# Patient Record
Sex: Female | Born: 1980 | Race: Black or African American | Hispanic: No | Marital: Single | Smoking: Current every day smoker
Health system: Southern US, Community
[De-identification: ages and names within clinical notes are randomized; demographics above are authoritative.]

## PROBLEM LIST (undated history)

## (undated) DIAGNOSIS — I10 Essential (primary) hypertension: Secondary | ICD-10-CM

---

## 2007-10-30 ENCOUNTER — Emergency Department: Payer: Self-pay | Admitting: Emergency Medicine

## 2015-10-14 ENCOUNTER — Encounter: Payer: Self-pay | Admitting: Medical Oncology

## 2015-10-14 ENCOUNTER — Inpatient Hospital Stay
Admission: EM | Admit: 2015-10-14 | Discharge: 2015-10-19 | DRG: 077 | Disposition: A | Discharge status: Home Health | Payer: Self-pay | Attending: Internal Medicine | Admitting: Internal Medicine

## 2015-10-14 ENCOUNTER — Emergency Department: Payer: Self-pay

## 2015-10-14 DIAGNOSIS — E1122 Type 2 diabetes mellitus with diabetic chronic kidney disease: Secondary | ICD-10-CM | POA: Diagnosis present

## 2015-10-14 DIAGNOSIS — N179 Acute kidney failure, unspecified: Secondary | ICD-10-CM

## 2015-10-14 DIAGNOSIS — E1100 Type 2 diabetes mellitus with hyperosmolarity without nonketotic hyperglycemic-hyperosmolar coma (NKHHC): Secondary | ICD-10-CM

## 2015-10-14 DIAGNOSIS — I6350 Cerebral infarction due to unspecified occlusion or stenosis of unspecified cerebral artery: Secondary | ICD-10-CM

## 2015-10-14 DIAGNOSIS — I161 Hypertensive emergency: Secondary | ICD-10-CM | POA: Diagnosis present

## 2015-10-14 DIAGNOSIS — N170 Acute kidney failure with tubular necrosis: Secondary | ICD-10-CM | POA: Diagnosis present

## 2015-10-14 DIAGNOSIS — R200 Anesthesia of skin: Secondary | ICD-10-CM

## 2015-10-14 DIAGNOSIS — N183 Chronic kidney disease, stage 3 (moderate): Secondary | ICD-10-CM | POA: Diagnosis present

## 2015-10-14 DIAGNOSIS — E860 Dehydration: Secondary | ICD-10-CM | POA: Diagnosis present

## 2015-10-14 DIAGNOSIS — F17210 Nicotine dependence, cigarettes, uncomplicated: Secondary | ICD-10-CM | POA: Diagnosis present

## 2015-10-14 DIAGNOSIS — R4182 Altered mental status, unspecified: Secondary | ICD-10-CM

## 2015-10-14 DIAGNOSIS — I674 Hypertensive encephalopathy: Principal | ICD-10-CM | POA: Diagnosis present

## 2015-10-14 DIAGNOSIS — Z8249 Family history of ischemic heart disease and other diseases of the circulatory system: Secondary | ICD-10-CM

## 2015-10-14 DIAGNOSIS — E1121 Type 2 diabetes mellitus with diabetic nephropathy: Secondary | ICD-10-CM | POA: Diagnosis present

## 2015-10-14 DIAGNOSIS — E669 Obesity, unspecified: Secondary | ICD-10-CM | POA: Diagnosis present

## 2015-10-14 DIAGNOSIS — D72829 Elevated white blood cell count, unspecified: Secondary | ICD-10-CM | POA: Diagnosis present

## 2015-10-14 DIAGNOSIS — R7989 Other specified abnormal findings of blood chemistry: Secondary | ICD-10-CM

## 2015-10-14 DIAGNOSIS — R202 Paresthesia of skin: Secondary | ICD-10-CM

## 2015-10-14 DIAGNOSIS — E876 Hypokalemia: Secondary | ICD-10-CM | POA: Diagnosis present

## 2015-10-14 DIAGNOSIS — Z833 Family history of diabetes mellitus: Secondary | ICD-10-CM

## 2015-10-14 DIAGNOSIS — R778 Other specified abnormalities of plasma proteins: Secondary | ICD-10-CM

## 2015-10-14 DIAGNOSIS — I129 Hypertensive chronic kidney disease with stage 1 through stage 4 chronic kidney disease, or unspecified chronic kidney disease: Secondary | ICD-10-CM | POA: Diagnosis present

## 2015-10-14 DIAGNOSIS — Z6841 Body Mass Index (BMI) 40.0 and over, adult: Secondary | ICD-10-CM

## 2015-10-14 DIAGNOSIS — G9340 Encephalopathy, unspecified: Secondary | ICD-10-CM | POA: Diagnosis present

## 2015-10-14 DIAGNOSIS — I639 Cerebral infarction, unspecified: Secondary | ICD-10-CM

## 2015-10-14 DIAGNOSIS — E1165 Type 2 diabetes mellitus with hyperglycemia: Secondary | ICD-10-CM | POA: Diagnosis present

## 2015-10-14 HISTORY — DX: Essential (primary) hypertension: I10

## 2015-10-14 LAB — COMPREHENSIVE METABOLIC PANEL
ALK PHOS: 101 U/L (ref 38–126)
ALK PHOS: 86 U/L (ref 38–126)
ALT: 16 U/L (ref 14–54)
ALT: 14 U/L (ref 14–54)
ANION GAP: 10 (ref 5–15)
AST: 22 U/L (ref 15–41)
AST: 17 U/L (ref 15–41)
Albumin: 4.4 g/dL (ref 3.5–5.0)
Albumin: 4.1 g/dL (ref 3.5–5.0)
Anion gap: 11 (ref 5–15)
BUN: 21 mg/dL — AB (ref 6–20)
BUN: 18 mg/dL (ref 6–20)
CALCIUM: 9.5 mg/dL (ref 8.9–10.3)
CALCIUM: 9.4 mg/dL (ref 8.9–10.3)
CO2: 24 mmol/L (ref 22–32)
CO2: 27 mmol/L (ref 22–32)
CREATININE: 2.05 mg/dL — AB (ref 0.44–1.00)
Chloride: 96 mmol/L — ABNORMAL LOW (ref 101–111)
Chloride: 105 mmol/L (ref 101–111)
Creatinine, Ser: 1.56 mg/dL — ABNORMAL HIGH (ref 0.44–1.00)
GFR, EST AFRICAN AMERICAN: 35 mL/min — AB (ref >60)
GFR, EST AFRICAN AMERICAN: 49 mL/min — AB (ref >60)
GFR, EST NON AFRICAN AMERICAN: 30 mL/min — AB (ref >60)
GFR, EST NON AFRICAN AMERICAN: 42 mL/min — AB (ref >60)
Glucose, Bld: 801 mg/dL (ref 65–99)
Glucose, Bld: 150 mg/dL — ABNORMAL HIGH (ref 65–99)
Potassium: 3.2 mmol/L — ABNORMAL LOW (ref 3.5–5.1)
Potassium: 3.1 mmol/L — ABNORMAL LOW (ref 3.5–5.1)
SODIUM: 142 mmol/L (ref 135–145)
Sodium: 131 mmol/L — ABNORMAL LOW (ref 135–145)
Total Bilirubin: 0.5 mg/dL (ref 0.3–1.2)
Total Bilirubin: 0.3 mg/dL (ref 0.3–1.2)
Total Protein: 9 g/dL — ABNORMAL HIGH (ref 6.5–8.1)
Total Protein: 8.4 g/dL — ABNORMAL HIGH (ref 6.5–8.1)

## 2015-10-14 LAB — GLUCOSE, CAPILLARY
GLUCOSE-CAPILLARY: 515 mg/dL — AB (ref 65–99)
GLUCOSE-CAPILLARY: 515 mg/dL — AB (ref 65–99)
GLUCOSE-CAPILLARY: 466 mg/dL — AB (ref 65–99)
GLUCOSE-CAPILLARY: 404 mg/dL — AB (ref 65–99)
GLUCOSE-CAPILLARY: 345 mg/dL — AB (ref 65–99)
GLUCOSE-CAPILLARY: 269 mg/dL — AB (ref 65–99)
GLUCOSE-CAPILLARY: 161 mg/dL — AB (ref 65–99)
GLUCOSE-CAPILLARY: 240 mg/dL — AB (ref 65–99)
GLUCOSE-CAPILLARY: 228 mg/dL — AB (ref 65–99)
GLUCOSE-CAPILLARY: 182 mg/dL — AB (ref 65–99)
Glucose-Capillary: 592 mg/dL (ref 65–99)
Glucose-Capillary: 598 mg/dL (ref 65–99)
Glucose-Capillary: 212 mg/dL — ABNORMAL HIGH (ref 65–99)
Glucose-Capillary: 142 mg/dL — ABNORMAL HIGH (ref 65–99)
Glucose-Capillary: 148 mg/dL — ABNORMAL HIGH (ref 65–99)

## 2015-10-14 LAB — URINALYSIS COMPLETE WITH MICROSCOPIC (ARMC ONLY)
Bacteria, UA: NONE SEEN
Bilirubin Urine: NEGATIVE
Glucose, UA: >500 mg/dL — AB
KETONES UR: NEGATIVE mg/dL
Leukocytes, UA: NEGATIVE
Nitrite: NEGATIVE
PH: 6 (ref 5.0–8.0)
PROTEIN: 100 mg/dL — AB
Specific Gravity, Urine: 1.021 (ref 1.005–1.030)

## 2015-10-14 LAB — CBC WITH DIFFERENTIAL/PLATELET
BASOS ABS: 0.1 10*3/uL (ref 0–0.1)
Basophils Relative: 1 %
Eosinophils Absolute: 0.1 10*3/uL (ref 0–0.7)
Eosinophils Relative: 0 %
HCT: 47.3 % — ABNORMAL HIGH (ref 35.0–47.0)
HEMOGLOBIN: 15 g/dL (ref 12.0–16.0)
LYMPHS ABS: 1.3 10*3/uL (ref 1.0–3.6)
LYMPHS PCT: 9 %
MCH: 23.7 pg — AB (ref 26.0–34.0)
MCHC: 31.6 g/dL — ABNORMAL LOW (ref 32.0–36.0)
MCV: 74.9 fL — AB (ref 80.0–100.0)
Monocytes Absolute: 0.9 10*3/uL (ref 0.2–0.9)
Monocytes Relative: 6 %
NEUTROS ABS: 12.8 10*3/uL — AB (ref 1.4–6.5)
NEUTROS PCT: 84 %
Platelets: 305 10*3/uL (ref 150–440)
RBC: 6.32 MIL/uL — AB (ref 3.80–5.20)
RDW: 13.9 % (ref 11.5–14.5)
WBC: 15.2 10*3/uL — AB (ref 3.6–11.0)

## 2015-10-14 LAB — URINE DRUG SCREEN, QUALITATIVE (ARMC ONLY)
Amphetamines, Ur Screen: NOT DETECTED
BARBITURATES, UR SCREEN: NOT DETECTED
BENZODIAZEPINE, UR SCRN: NOT DETECTED
CANNABINOID 50 NG, UR ~~LOC~~: NOT DETECTED
COCAINE METABOLITE, UR ~~LOC~~: NOT DETECTED
MDMA (Ecstasy)Ur Screen: NOT DETECTED
METHADONE SCREEN, URINE: NOT DETECTED
Opiate, Ur Screen: NOT DETECTED
Phencyclidine (PCP) Ur S: NOT DETECTED
TRICYCLIC, UR SCREEN: NOT DETECTED

## 2015-10-14 LAB — BLOOD GAS, VENOUS
Acid-base deficit: 1.5 mmol/L (ref 0.0–2.0)
Bicarbonate: 24.8 mEq/L (ref 21.0–28.0)
FIO2: 21
PATIENT TEMPERATURE: 37
PCO2 VEN: 47 mmHg (ref 44.0–60.0)
pH, Ven: 7.33 (ref 7.320–7.430)

## 2015-10-14 LAB — ETHANOL

## 2015-10-14 LAB — HCG, QUANTITATIVE, PREGNANCY: HCG, BETA CHAIN, QUANT, S: 1 m[IU]/mL (ref <5)

## 2015-10-14 LAB — TSH: TSH: 0.411 u[IU]/mL (ref 0.350–4.500)

## 2015-10-14 LAB — TROPONIN I: TROPONIN I: 0.04 ng/mL — AB (ref <0.031)

## 2015-10-14 MED ORDER — SODIUM CHLORIDE 0.9 % IV SOLN
1000.0000 mL | Freq: Once | INTRAVENOUS | Status: AC
  Administered 2015-10-14: 1000 mL via INTRAVENOUS

## 2015-10-14 MED ORDER — HYDROCODONE-ACETAMINOPHEN 5-325 MG PO TABS: 1.0000 | ORAL_TABLET | ORAL | Status: DC | PRN

## 2015-10-14 MED ORDER — ALUM & MAG HYDROXIDE-SIMETH 200-200-20 MG/5ML PO SUSP: 30.0000 mL | Freq: Four times a day (QID) | ORAL | Status: DC | PRN

## 2015-10-14 MED ORDER — LORAZEPAM 2 MG/ML IJ SOLN
2.0000 mg | Freq: Once | INTRAMUSCULAR | Status: AC
  Administered 2015-10-14: 2 mg via INTRAVENOUS

## 2015-10-14 MED ORDER — BISACODYL 5 MG PO TBEC: 5.0000 mg | DELAYED_RELEASE_TABLET | Freq: Every day | ORAL | Status: DC | PRN

## 2015-10-14 MED ORDER — SODIUM CHLORIDE 0.9 % IJ SOLN
3.0000 mL | Freq: Two times a day (BID) | INTRAMUSCULAR | Status: DC
  Administered 2015-10-14 – 2015-10-19 (×11): 3 mL via INTRAVENOUS

## 2015-10-14 MED ORDER — ASPIRIN 81 MG PO CHEW
81.0000 mg | CHEWABLE_TABLET | Freq: Every day | ORAL | Status: DC
  Administered 2015-10-14 – 2015-10-19 (×6): 81 mg via ORAL
  Filled 2015-10-14 (×5): qty 1

## 2015-10-14 MED ORDER — ACETAMINOPHEN 650 MG RE SUPP: 650.0000 mg | Freq: Four times a day (QID) | RECTAL | Status: DC | PRN

## 2015-10-14 MED ORDER — HYDRALAZINE HCL 20 MG/ML IJ SOLN
10.0000 mg | Freq: Four times a day (QID) | INTRAMUSCULAR | Status: DC | PRN
  Administered 2015-10-14 – 2015-10-15 (×3): 10 mg via INTRAVENOUS
  Filled 2015-10-14 (×3): qty 1

## 2015-10-14 MED ORDER — INFLUENZA VAC SPLIT QUAD 0.5 ML IM SUSY
0.5000 mL | PREFILLED_SYRINGE | INTRAMUSCULAR | Status: AC
  Administered 2015-10-15: 0.5 mL via INTRAMUSCULAR
  Filled 2015-10-14: qty 0.5

## 2015-10-14 MED ORDER — LABETALOL HCL 5 MG/ML IV SOLN
20.0000 mg | Freq: Once | INTRAVENOUS | Status: AC
  Administered 2015-10-14: 20 mg via INTRAVENOUS

## 2015-10-14 MED ORDER — LORAZEPAM 2 MG/ML IJ SOLN
INTRAMUSCULAR | Status: AC
  Filled 2015-10-14: qty 1

## 2015-10-14 MED ORDER — SODIUM CHLORIDE 0.9 % IV SOLN
INTRAVENOUS | Status: DC
  Administered 2015-10-14: 17:00:00 via INTRAVENOUS

## 2015-10-14 MED ORDER — POTASSIUM CHLORIDE 10 MEQ/100ML IV SOLN
10.0000 meq | INTRAVENOUS | Status: AC
  Administered 2015-10-14 – 2015-10-15 (×4): 10 meq via INTRAVENOUS
  Filled 2015-10-14 (×5): qty 100

## 2015-10-14 MED ORDER — ATORVASTATIN CALCIUM 20 MG PO TABS
40.0000 mg | ORAL_TABLET | Freq: Every day | ORAL | Status: DC
  Administered 2015-10-14 – 2015-10-18 (×5): 40 mg via ORAL
  Filled 2015-10-14 (×4): qty 2

## 2015-10-14 MED ORDER — INSULIN REGULAR HUMAN 100 UNIT/ML IJ SOLN
INTRAMUSCULAR | Status: DC
  Filled 2015-10-14: qty 2.5

## 2015-10-14 MED ORDER — TRAZODONE HCL 50 MG PO TABS
25.0000 mg | ORAL_TABLET | Freq: Every evening | ORAL | Status: DC | PRN
  Administered 2015-10-16: 25 mg via ORAL
  Filled 2015-10-14: qty 1

## 2015-10-14 MED ORDER — SODIUM CHLORIDE 0.9 % IV SOLN
INTRAVENOUS | Status: DC
  Administered 2015-10-14: 4.1 [IU]/h via INTRAVENOUS
  Administered 2015-10-14: 5.4 [IU]/h via INTRAVENOUS
  Filled 2015-10-14: qty 2.5

## 2015-10-14 MED ORDER — ACETAMINOPHEN 325 MG PO TABS
650.0000 mg | ORAL_TABLET | Freq: Four times a day (QID) | ORAL | Status: DC | PRN
  Administered 2015-10-15 – 2015-10-16 (×2): 650 mg via ORAL
  Filled 2015-10-14 (×2): qty 2

## 2015-10-14 MED ORDER — PNEUMOCOCCAL VAC POLYVALENT 25 MCG/0.5ML IJ INJ
0.5000 mL | INJECTION | INTRAMUSCULAR | Status: AC
  Administered 2015-10-15: 0.5 mL via INTRAMUSCULAR
  Filled 2015-10-14: qty 0.5

## 2015-10-14 MED ORDER — LABETALOL HCL 5 MG/ML IV SOLN
INTRAVENOUS | Status: AC
  Administered 2015-10-14: 20 mg via INTRAVENOUS
  Filled 2015-10-14: qty 4

## 2015-10-14 MED ORDER — KCL IN DEXTROSE-NACL 40-5-0.45 MEQ/L-%-% IV SOLN
INTRAVENOUS | Status: DC
  Administered 2015-10-14: 19:00:00 via INTRAVENOUS
  Filled 2015-10-14 (×6): qty 1000

## 2015-10-14 MED ORDER — LABETALOL HCL 5 MG/ML IV SOLN
10.0000 mg | INTRAVENOUS | Status: DC | PRN
  Administered 2015-10-14 – 2015-10-18 (×7): 10 mg via INTRAVENOUS
  Filled 2015-10-14 (×9): qty 4

## 2015-10-14 MED ORDER — DEXMEDETOMIDINE HCL IN NACL 400 MCG/100ML IV SOLN
0.4000 ug/kg/h | INTRAVENOUS | Status: DC
  Administered 2015-10-14: 0.5 ug/kg/h via INTRAVENOUS
  Administered 2015-10-15: 0.1 ug/kg/h via INTRAVENOUS
  Filled 2015-10-14 (×2): qty 100

## 2015-10-14 MED ORDER — HEPARIN SODIUM (PORCINE) 5000 UNIT/ML IJ SOLN
5000.0000 [IU] | Freq: Three times a day (TID) | INTRAMUSCULAR | Status: DC
  Administered 2015-10-14 – 2015-10-19 (×16): 5000 [IU] via SUBCUTANEOUS
  Filled 2015-10-14 (×15): qty 1

## 2015-10-14 MED ORDER — ONDANSETRON HCL 4 MG/2ML IJ SOLN: 4.0000 mg | Freq: Four times a day (QID) | INTRAMUSCULAR | Status: DC | PRN

## 2015-10-14 MED ORDER — ASPIRIN 81 MG PO CHEW
324.0000 mg | CHEWABLE_TABLET | Freq: Once | ORAL | Status: AC
  Administered 2015-10-14: 324 mg via ORAL
  Filled 2015-10-14: qty 4

## 2015-10-14 MED ORDER — ONDANSETRON HCL 4 MG PO TABS: 4.0000 mg | ORAL_TABLET | Freq: Four times a day (QID) | ORAL | Status: DC | PRN

## 2015-10-14 MED ORDER — LORAZEPAM 2 MG/ML IJ SOLN
INTRAMUSCULAR | Status: AC
  Administered 2015-10-14: 2 mg via INTRAVENOUS
  Filled 2015-10-14: qty 1

## 2015-10-14 MED ORDER — LORAZEPAM 2 MG/ML IJ SOLN
2.0000 mg | Freq: Once | INTRAMUSCULAR | Status: AC
  Administered 2015-10-14: 2 mg via INTRAVENOUS
  Filled 2015-10-14: qty 1

## 2015-10-14 MED ORDER — POTASSIUM CHLORIDE IN NACL 40-0.9 MEQ/L-% IV SOLN
INTRAVENOUS | Status: DC
  Administered 2015-10-14: 100 mL/h via INTRAVENOUS
  Filled 2015-10-14 (×3): qty 1000

## 2015-10-14 MED ORDER — CHLORHEXIDINE GLUCONATE 0.12 % MT SOLN
15.0000 mL | Freq: Two times a day (BID) | OROMUCOSAL | Status: DC
  Administered 2015-10-14 – 2015-10-16 (×4): 15 mL via OROMUCOSAL
  Filled 2015-10-14 (×2): qty 15

## 2015-10-14 MED ORDER — NICARDIPINE HCL IN NACL 20-0.86 MG/200ML-% IV SOLN
3.0000 mg/h | INTRAVENOUS | Status: DC
  Filled 2015-10-14: qty 200

## 2015-10-14 MED ORDER — DOCUSATE SODIUM 100 MG PO CAPS
100.0000 mg | ORAL_CAPSULE | Freq: Two times a day (BID) | ORAL | Status: DC
Start: 2015-10-14 — End: 2015-10-19
  Administered 2015-10-15 – 2015-10-19 (×9): 100 mg via ORAL
  Filled 2015-10-14 (×9): qty 1

## 2015-10-14 MED ORDER — CETYLPYRIDINIUM CHLORIDE 0.05 % MT LIQD
7.0000 mL | Freq: Two times a day (BID) | OROMUCOSAL | Status: DC
  Administered 2015-10-14 – 2015-10-19 (×6): 7 mL via OROMUCOSAL

## 2015-10-14 MED ORDER — SODIUM CHLORIDE 0.9 % IV SOLN
INTRAVENOUS | Status: DC
  Administered 2015-10-14: 11.8 [IU]/h via INTRAVENOUS
  Administered 2015-10-14: 10.5 [IU]/h via INTRAVENOUS
  Administered 2015-10-15: 1.8 [IU]/h via INTRAVENOUS
  Administered 2015-10-15: 2.4 [IU]/h via INTRAVENOUS
  Administered 2015-10-15: 2.5 [IU]/h via INTRAVENOUS
  Administered 2015-10-15: 8.5 [IU]/h via INTRAVENOUS

## 2015-10-14 NOTE — ED Notes (Signed)
Pt from home via ems with reports that pts father found pt laying on the floor "not acting right", when ems arrived pt was confused and combative. Pt was noted to have Blood glucose "high" with ems.

## 2015-10-14 NOTE — Progress Notes (Signed)
Patients blood pressure has increased. >180/>120. MD notified and ordered Hydralazine 10mg  IV Q 6 hours PRN.

## 2015-10-14 NOTE — ED Provider Notes (Addendum)
[facility] Emergency Department Provider Note   L5 caveat: Review of systems and history cannot be obtained due to altered mental status  Time seen: ----------------------------------------- 9:03 AM on 10/14/2015 -----------------------------------------    I have reviewed the triage vital signs and the nursing notes.   HISTORY  Chief Complaint Altered Mental Status    HPI Sabrina Davenport is a 34 y.o. female brought to the ER by EMS with confusion. Patient was found by her father laying on the floor not acting right. When EMS arrived patient was confused and combative, brought in prone and restrained. Patient was noted to have high blood glucose noted by EMS. Patient was also noted to have blood in and around her mouth, concerned she may have had a seizure. Patient reportedly does not have a seizure history.   No past medical history on file.  There are no active problems to display for this patient.   No past surgical history on file.  Allergies Review of patient's allergies indicates not on file.  Social History Social History  Substance Use Topics  . Smoking status: Not on file  . Smokeless tobacco: Not on file  . Alcohol Use: Not on file    Review of Systems ENT: Positive for blood in her mouth Neurological:  Positive for altered mental status  Review of systems otherwise unknown.  ____________________________________________   PHYSICAL EXAM:  VITAL SIGNS: ED Triage Vitals  Enc Vitals Group     BP --      Pulse --      Resp --      Temp --      Temp src --      SpO2 --      Weight --      Height --      Head Cir --      Peak Flow --      Pain Score --      Pain Loc --      Pain Edu? --      Excl. in GC? --     Constitutional: Alert but disoriented and combative at times. Moderate distress. Eyes: Conjunctivae are normal. PERRL. Normal extraocular movements. ENT   Head: Normocephalic and atraumatic.   Nose:  No congestion/rhinnorhea.   Mouth/Throat: Mucous membranes are moist, there is dried blood in the oral mucosa and on her teeth.   Neck: No stridor. Cardiovascular: Rapid rate, regular rhythm. Normal and symmetric distal pulses are present in all extremities. No murmurs, rubs, or gallops. Respiratory: Normal respiratory effort without tachypnea nor retractions. Breath sounds are clear and equal bilaterally Gastrointestinal: Soft and nontender. No distention.  Musculoskeletal: Nontender with normal range of motion in all extremities. No joint effusions.  No lower extremity tenderness nor edema. Neurologic:  Abnormal speech and language, patient is confused and combative. Skin:  Skin is warm, dry and intact. No rash noted. ____________________________________________  EKG: Interpreted by me. Sinus tachycardia with a rate of 131 bpm, normal PR interval, normal QRS with, normal QT interval. There is T wave inversions.  ____________________________________________  ED COURSE:  Pertinent labs & imaging results that were available during my care of the patient were reviewed by me and considered in my medical decision making (see chart for details). Unclear etiology of her symptoms, possible postictal state with hyperglycemia. They did blood sugar read critical high. ____________________________________________    LABS (pertinent positives/negatives)  Labs Reviewed  CBC WITH DIFFERENTIAL/PLATELET - Abnormal; Notable for the following:  WBC 15.2 (*)    RBC 6.32 (*)    HCT 47.3 (*)    MCV 74.9 (*)    MCH 23.7 (*)    MCHC 31.6 (*)    Neutro Abs 12.8 (*)    All other components within normal limits  COMPREHENSIVE METABOLIC PANEL - Abnormal; Notable for the following:    Sodium 131 (*)    Potassium 3.2 (*)    Chloride 96 (*)    Glucose, Bld 801 (*)    BUN 21 (*)    Creatinine, Ser 2.05 (*)    Total Protein 9.0 (*)    GFR calc non Af Amer 30 (*)    GFR calc Af Amer 35 (*)    All  other components within normal limits  TROPONIN I - Abnormal; Notable for the following:    Troponin I 0.04 (*)    All other components within normal limits  URINALYSIS COMPLETEWITH MICROSCOPIC ([facility] ONLY) - Abnormal; Notable for the following:    Color, Urine STRAW (*)    APPearance CLEAR (*)    Glucose, UA >500 (*)    Hgb urine dipstick 1+ (*)    Protein, ur 100 (*)    Squamous Epithelial / LPF 0-5 (*)    All other components within normal limits  GLUCOSE, CAPILLARY - Abnormal; Notable for the following:    Glucose-Capillary >600 (*)    All other components within normal limits  HCG, QUANTITATIVE, PREGNANCY  ETHANOL  URINE DRUG SCREEN, QUALITATIVE ([facility] ONLY)  BLOOD GAS, VENOUS  CBG MONITORING, ED   CRITICAL CARE Performed by: Emily Filbert   Total critical care time: 45 minutes  Critical care time was exclusive of separately billable procedures and treating other patients.  Critical care was necessary to treat or prevent imminent or life-threatening deterioration.  Critical care was time spent personally by me on the following activities: development of treatment plan with patient and/or surrogate as well as nursing, discussions with consultants, evaluation of patient's response to treatment, examination of patient, obtaining history from patient or surrogate, ordering and performing treatments and interventions, ordering and review of laboratory studies, ordering and review of radiographic studies, pulse oximetry and re-evaluation of patient's condition.   RADIOLOGY  CT head IMPRESSION: Small probable acute infarct in the anterior limb of the right external capsule. Gray-white compartments elsewhere appear normal. No hemorrhage or mass effect. ____________________________________________  FINAL ASSESSMENT AND PLAN  Altered mental status, hyperglycemic hyperosmolar, acute renal failure, CVA, hypertension  Plan: Patient with labs and imaging as dictated  above. Patient with multiple acute emergency medical conditions. Notably patient is still altered from what is likely hyperglycemic hyperosmolar nonketotic ketosis. She presents also with acute renal failure, likely recent CVA. Patient will be given aspirin here, will continue on insulin drip. She has received 2 L of saline to this point, is improved significantly. We have also given her a dose of labetalol 20 mg IV for treatment of her hypertension is being placed on a nicardipine drip for hypertensive emergency.. She remains in critical condition.   Emily Filbert, MD   Emily Filbert, MD 10/14/15 1117  Emily Filbert, MD 10/14/15 [phone]

## 2015-10-14 NOTE — Progress Notes (Signed)
Patient transferred to unit from ED. Patient is alert and oriented x 3. She has delayed resopnse with slurred speech. MD is aware. VS: BP 148/104 mmHg  Pulse 114  Temp(Src) 97 F (36.1 C) (Axillary)  Resp 26  Ht 5\' 8"  (1.727 m)  Wt 101 kg (222 lb 10.6 oz)  BMI 33.86 kg/m2  SpO2 98%. Patient restless in bed with safety sitter at bedside.

## 2015-10-14 NOTE — Consult Note (Addendum)
Endocrine Initial Consult Note Date of Consult: 10/14/2015  MD Requesting Consult: Delfino Lovett  SUBJECTIVE: Reason for Consultation: hyperglycemia   History of Present Illness: Sabrina Davenport is a 34 y.o. female with PMH hypertension admitted with altered mental status, severe hypertension, and hyperglycemia with BG 800's. The history was obtained from the patient's mother since the patient is unable to provide a history. Mother reports that Ms. Kertesz does not have a PCP. To her knowledge, the patient has not been taking medication for her hypertension. The patient does not have a known history of diabetes. Neither parents or sibling have diabetes but she has maternal relatives with diabetes. The patient was in her usual state of health until today when the patient's father came home to fine her confused. He witnessed her fall and apparently she lay on the ground with her eyes closed breathing "hard." Mother denies any witness jerking/shaking, tongue biting, or bowel/bladder incontinence. Mother denies any recent sick contacts and states the patient has not shown any signs of illness prior to today. EMS was called and reportedly patient was quite confused, combative, and hypertensive upon EMS arrival. She was transported to the [facility] ED.  Per mother, Ms Papandrea takes no medications currently. She smokes <1/2 ppd of cigarettes. No alcohol or illicit drug use. She lives with her parents.   Patient Active Problem List   Diagnosis Date Noted  . Encephalopathy acute 10/14/2015     Past Medical History  Diagnosis Date  . Hypertension    History reviewed. No pertinent past surgical history. Family History  Problem Relation Age of Onset  . Hypertension Mother     Social History:  Social History  Substance Use Topics  . Smoking status: Current Every Day Smoker -- 0.50 packs/day for 10 years    Types: Cigarettes  . Smokeless tobacco: Not on file  . Alcohol Use: No     No Known Allergies    Medications:  none  Review of Systems: Could not be completed given patient's mental state. However, mother states the patient has not complained of fevers, abdominal pain, nausea, vomiting, or any other symptoms.   OBJECTIVE: Temp:  [97 F (36.1 C)] 97 F (36.1 C) (10/27 0859) Pulse Rate:  [101-130] 102 (10/27 1800) Resp:  [14-33] 30 (10/27 1700) BP: (131-239)/(83-169) 186/130 mmHg (10/27 1800) SpO2:  [93 %-100 %] 99 % (10/27 1800) FiO2 (%):  [21 %] 21 % (10/27 1623) Weight:  [101 kg (222 lb 10.6 oz)-126.1 kg (278 lb)] 101 kg (222 lb 10.6 oz) (10/27 1631)  Temp (24hrs), Avg:97 F (36.1 C), Min:97 F (36.1 C), Max:97 F (36.1 C)  Weight: 101 kg (222 lb 10.6 oz)  Physical Exam: Gen: lying in bed, easily arousable but disoriented, NAD HEENT: NCAT, eyes anicteric, pupils not visualized well as patient unable to keep eyes open, mucous membranes dry Neck: Supple CVS: tachycardic, s1, s2, no murmur appreciated Lungs: limited auscultation with patient not taking deep breaths on command Abd: soft, nontender to palpation Extr: warm, well-perfused, no ulcerations on either feet Skin: warm, dry Neuro: disoriented, not following commands, not verbalizing, could not illicit reflexes Psych: unable to assess  Labs: BMP Latest Ref Rng 10/14/2015  Glucose 65 - 99 mg/dL 469(GE)  BUN 6 - 20 mg/dL 95(M)  Creatinine 8.41 - 1.00 mg/dL 3.24(M)  Sodium 010 - 272 mmol/L 131(L)  Potassium 3.5 - 5.1 mmol/L 3.2(L)  Chloride 101 - 111 mmol/L 96(L)  CO2 22 - 32 mmol/L 24  Calcium 8.9 -  10.3 mg/dL 9.5   CBC Latest Ref Rng 10/14/2015  WBC 3.6 - 11.0 K/uL 15.2(H)  Hemoglobin 12.0 - 16.0 g/dL 16.115.0  Hematocrit 09.635.0 - 47.0 % 47.3(H)  Platelets 150 - 440 K/uL 305   Blood glucose values reviewed in glucose accordion view  ASSESSMENT:   34 yo woman presenting with altered mental status admitted to the ICU with hypertensive urgency/emergency, severe hyperglycemia, acute renal failure, and  electrolyte derangements including hypokalemia. Anion gap is within normal limits. She is in a hyperosmolar hyperglycemic state (HHS) in the setting of uncontrolled undiagnosed type 2 diabetes mellitus.  RECOMMENDATIONS:   Aggressive IV fluid hydration with normal saline, then switch to D5 1/2 NS once BG<250 Monitor urine output closely, foley cath if needed Electrolyte repletion; especially potassium as insulin will drive levels down Would replete with at least 60-80 mEq KCl over the next 6-8 hours; 40 mEq now Monitor BMP q4-6hr until stable; STAT BMP now IV insulin infusion 0.1u/kg/hr, then titrate to maintain BG 200-300 range Would not be too aggressive with glycemic control until mental status starts to improve Glucose monitoring: q1h Hypoglycemia protocol in place Hb A1c pending Once her condition stabilizes, we can tighten glycemic control further Nephrology is following for management of her hypertension and ARF Recommendations were discussed with the ICU RN caring for the patient Will follow along closely. Patient will be seen by Dr. Tedd SiasSolum tomorrow  Thank you for allowing me to participate in this patient's care.  Doylene CanningAbby Carena Stream, MD [facility]

## 2015-10-14 NOTE — Progress Notes (Signed)
MRI called and stated that she cannot have MRI until off of insulin drip due to the fact that the IV machine cannot go near MRI. MD notified and agreeable to doing MRI after she is off of the insulin drip. See passed the bedside swallow eval and MD ordered pt to start on clear liquid diet.

## 2015-10-14 NOTE — H&P (Signed)
[facility] [place] [facility]   PATIENT NAME: Sabrina FallsJennifer Davenport    MR#:  [id number]  DATE OF BIRTH:  [birth year]  DATE OF ADMISSION:  10/14/2015  PRIMARY CARE PHYSICIAN: No primary care provider on file.   REQUESTING/REFERRING PHYSICIAN: Daryel NovemberWilliams, Jonathan, M.D.  CHIEF COMPLAINT:   Chief Complaint  Patient presents with  . Altered Mental Status   HISTORY OF PRESENT ILLNESS:  Sabrina Davenport  is a 34 y.o. female with a known history of hypertension brought to the ER by EMS with confusion. Patient was found by her father laying on the floor not acting right. When EMS arrived patient was confused and combative, brought in prone and restrained. Patient was noted to have high blood glucose noted by EMS. Patient was also noted to have blood in and around her mouth, concerned she may have had a seizure. Patient reportedly does not have a seizure history.  As per patient's mother, who is at bedside, he may have passed out in the kitchen as there was a lot of stuff knocked over there.  Usually she could not identify her dad, but she is able to identify her mother now and knows that she is here to hospital but could not tell me the name.  She is still quite agitated and confused and her blood pressure was 220/110 when I saw her. PAST MEDICAL HISTORY:  History reviewed. No pertinent past medical history. Hypertension PAST SURGICAL HISTORY:  History reviewed. No pertinent past surgical history. SOCIAL HISTORY:   Social History  Substance Use Topics  . Smoking status: Unknown If Ever Smoked  . Smokeless tobacco: Not on file  . Alcohol Use: Not on file  , smokes about half a Cigarette daily.  Her last 8-10 years.  No cough.  She lives at home with her parents. FAMILY HISTORY:  No family history on file.  Mother with hypertension.  Grandmother had diabetes DRUG ALLERGIES:  No Known Allergies REVIEW OF SYSTEMS:   Review of Systems  Unable to perform ROS: critical  illness   MEDICATIONS AT HOME:   Prior to Admission medications   Not on File  Was supposed to be in some blood pressure medication, although her mother says she had never taken any and does not recall what's the name of the medication either  VITAL SIGNS:  Blood pressure 205/130, pulse 106, temperature 97 F (36.1 C), temperature source Axillary, resp. rate 22, height 5\' 9"  (1.753 m), weight 126.1 kg (278 lb), SpO2 100 %. PHYSICAL EXAMINATION:  Physical Exam  Constitutional: She appears distressed.  HENT:  Head: Normocephalic and atraumatic.  Eyes: Conjunctivae and EOM are normal. Pupils are equal, round, and reactive to light.  Neck: Normal range of motion. Neck supple. No tracheal deviation present. No thyromegaly present.  Cardiovascular: Normal heart sounds.  Tachycardia present.   Pulmonary/Chest: Breath sounds normal. Tachypnea noted. She is in respiratory distress. She has no wheezes. She exhibits no tenderness.  Abdominal: Soft. Bowel sounds are normal. She exhibits no distension. There is no tenderness.  Musculoskeletal: Normal range of motion.  Neurological: She has normal sensation, normal strength and intact cranial nerves. She is disoriented. No cranial nerve deficit.  Skin: Skin is warm and dry. No rash noted.  Psychiatric: Mood and affect normal.  She has agitated and confused.   LABORATORY PANEL:   CBC  Recent Labs Lab 10/14/15 0905  WBC 15.2*  HGB 15.0  HCT 47.3*  PLT 305   ------------------------------------------------------------------------------------------------------------------  Chemistries  Recent Labs Lab 10/14/15 0905  NA 131*  K 3.2*  CL 96*  CO2 24  GLUCOSE 801*  BUN 21*  CREATININE 2.05*  CALCIUM 9.5  AST 22  ALT 16  ALKPHOS 101  BILITOT 0.5   ------------------------------------------------------------------------------------------------------------------  Cardiac Enzymes  Recent Labs Lab 10/14/15 0905  TROPONINI  0.04*   ------------------------------------------------------------------------------------------------------------------  RADIOLOGY:  Ct Head Wo Contrast  10/14/2015  CLINICAL DATA:  confusion/altered mental status EXAM: CT HEAD WITHOUT CONTRAST TECHNIQUE: Contiguous axial images were obtained from the base of the skull through the vertex without intravenous contrast. COMPARISON:  None. FINDINGS: The ventricles are normal in size and configuration. Cranial mass, hemorrhage, extra-axial fluid collection, or midline shift. There is a small focus of decreased attenuation in the anterior limb of the right external capsule consistent with a small recent infarct. Elsewhere gray-white compartments are normal. Bony calvarium appears intact. The mastoid air cells are clear. 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 Gray-white compartments elsewhere appear normal. No hemorrhage or mass effect. Electronically Signed   By: Bretta Bang III M.D.   On: 10/14/2015 11:07   IMPRESSION AND PLAN:  34 year old female with a known history of hypertension, not on medication.  He is being admitted for acute metabolic encephalopathy  * Acute metabolic encephalopathy: Likely multifactorial, not rule out underlying stroke as initial CT scan is worrisome for acute infarct.  We will get MRI of the brain, neurology consult.  This certainly could be due to hyper glycemic hyperosmolar state.  She also has hypertensive crisis  Will monitor in the ICU, and c/s intensivist for critical care management  * Acute renal failure: Could be prerenal.  Will start on aggressive IV hydration and consult nephrology.  Avoid any nephrotoxic medications   * hyperglycemic hyperosmolar nonketotic state: We will start on insulin drip and consult endocrinology.  This is new onset diabetes.  We will consult diabetic nurse coordinator.  Check hemoglobin A1c.  Monitor in CCU with aggressive  hydration..  TSH is normal  * Acute CVA: Seen on initial head CT, We will get MRI of the brain, neurology consult.  Start aspirin.  Check lipid profile and also add statin  * Hypertensive emergency: has recieved dose of labetalol 20 mg IV in ED and will started on nicardipine drip for hypertensive emergency.  Her pressure is getting better control on this.  She remains in critical condition.    All the records are reviewed and case discussed with ED provider. Management plans discussed with the patient, family and they are in agreement.  CODE STATUS: Full code  TOTAL TIME (Critical Care) TAKING CARE OF THIS PATIENT: 55 minutes.    [facility], Javone Ybanez M.D on 10/14/2015 at 1:05 PM  Between 7am to 6pm - Pager - [phone]  After 6pm go to [email or web address] - password [email or web address] [facility]  [place] [facility]  Office  [phone]  CC: Primary care physician; No primary care provider on file.

## 2015-10-14 NOTE — Progress Notes (Signed)
MD asked that patient be a strict I&O. Currently, pt is continent of her urine. Will attempt to measure urine without foley catheter, if she becomes incontinent MD asks that she have a foley catheter placed as she is in acute renal failure.

## 2015-10-14 NOTE — Consult Note (Signed)
Neurology:  [id number]  HPI: Sabrina Davenport is an 34 y.o. female known history of hypertension brought to the ER by EMS with confusion. Patient was found by her father laying on the floor not acting right. When EMS arrived patient was confused and combative, brought in prone and restrained. Patient was noted to have high blood glucose as well as elevated BP, CTH R internal capsule subacute stroke.    Past Medical History  Diagnosis Date  . Hypertension     History reviewed. No pertinent past surgical history.  Family History  Problem Relation Age of Onset  . Hypertension Mother     Social History:  reports that she has been smoking Cigarettes.  She has a 5 pack-year smoking history. She does not have any smokeless tobacco history on file. She reports that she does not drink alcohol. Her drug history is not on file.  No Known Allergies  Medications: I have reviewed the patient's current medications.  ROS: History obtained from the patient  General ROS: negative for - chills, fatigue, fever, night sweats, weight gain or weight loss Psychological ROS: negative for - behavioral disorder, hallucinations, memory difficulties, mood swings or suicidal ideation Ophthalmic ROS: negative for - blurry vision, double vision, eye pain or loss of vision ENT ROS: negative for - epistaxis, nasal discharge, oral lesions, sore throat, tinnitus or vertigo Allergy and Immunology ROS: negative for - hives or itchy/watery eyes Hematological and Lymphatic ROS: negative for - bleeding problems, bruising or swollen lymph nodes Endocrine ROS: history of DM Respiratory ROS: negative for - cough, hemoptysis, shortness of breath or wheezing Cardiovascular ROS: negative for - chest pain, dyspnea on exertion, edema or irregular heartbeat Gastrointestinal ROS: negative for - abdominal pain, diarrhea, hematemesis, nausea/vomiting or stool incontinence Genito-Urinary ROS: negative for - dysuria, hematuria,  incontinence or urinary frequency/urgency Musculoskeletal ROS: negative for - joint swelling or muscular weakness Neurological ROS: as noted in HPI Dermatological ROS: negative for rash and skin lesion changes  Physical Examination: Blood pressure 186/130, pulse 102, temperature 97 F (36.1 C), temperature source Axillary, resp. rate 30, height  (1.727 m), weight 222 lb 10.6 oz (101 kg), SpO2 99 %.   Neurological Examination Mental Status: Alert, oriented, thought content appropriate.  Speech fluent without evidence of aphasia.  Able to follow 3 step commands without difficulty. Cranial Nerves: II: Discs flat bilaterally; Visual fields grossly normal, pupils equal, round, reactive to light and accommodation III,IV, VI: ptosis not present, extra-ocular motions intact bilaterally V,VII: smile symmetric, facial light touch sensation normal bilaterally VIII: hearing normal bilaterally IX,X: gag reflex present XI: bilateral shoulder shrug XII: midline tongue extension Motor: Right : Upper extremity   5/5    Left:     Upper extremity   5/5  Lower extremity   5/5     Lower extremity   5/5 Tone and bulk:normal tone throughout; no atrophy noted Sensory: Pinprick and light touch intact throughout, bilaterally Deep Tendon Reflexes: 1+ and symmetric throughout Plantars: Right: downgoing   Left: downgoing Cerebellar: normal finger-to-nose, normal rapid alternating movements and normal heel-to-shin test Gait: not tested       Laboratory Studies:   Basic Metabolic Panel:  Recent Labs Lab 10/14/15 0905 10/14/15 1849  NA 131* 142  K 3.2* 3.1*  CL 96* 105  CO2 24 27  GLUCOSE 801* 150*  BUN 21* 18  CREATININE 2.05* 1.56*  CALCIUM 9.5 9.4    Liver Function Tests:  Recent Labs Lab 10/14/15 0905 10/14/15 1849  AST 22 17  ALT 16 14  ALKPHOS 101 86  BILITOT 0.5 0.3  PROT 9.0* 8.4*  ALBUMIN 4.4 4.1   No results for input(s): LIPASE, AMYLASE in the last 168 hours. No  results for input(s): AMMONIA in the last 168 hours.  CBC:  Recent Labs Lab 10/14/15 0905  WBC 15.2*  NEUTROABS 12.8*  HGB 15.0  HCT 47.3*  MCV 74.9*  PLT 305    Cardiac Enzymes:  Recent Labs Lab 10/14/15 0905  TROPONINI 0.04*    BNP: Invalid input(s): POCBNP  CBG:  Recent Labs Lab 10/14/15 1518 10/14/15 1614 10/14/15 1711 10/14/15 1814 10/14/15 1921  GLUCAP 345* 269* 212* 161* 142*    Microbiology: No results found for this or any previous visit.  Coagulation Studies: No results for input(s): LABPROT, INR in the last 72 hours.  Urinalysis:  Recent Labs Lab 10/14/15 0906  COLORURINE STRAW*  LABSPEC 1.021  PHURINE 6.0  GLUCOSEU >500*  HGBUR 1+*  BILIRUBINUR NEGATIVE  KETONESUR NEGATIVE  PROTEINUR 100*  NITRITE NEGATIVE  LEUKOCYTESUR NEGATIVE    Lipid Panel:  No results found for: CHOL, TRIG, HDL, CHOLHDL, VLDL, LDLCALC  HgbA1C: No results found for: HGBA1C  Urine Drug Screen:     Component Value Date/Time   LABOPIA NONE DETECTED 10/14/2015 0906   LABBENZ NONE DETECTED 10/14/2015 0906   AMPHETMU NONE DETECTED 10/14/2015 0906   THCU NONE DETECTED 10/14/2015 0906   LABBARB NONE DETECTED 10/14/2015 0906    Alcohol Level:  Recent Labs Lab 10/14/15 0905  ETH <5      Imaging: Ct Head Wo Contrast  10/14/2015  CLINICAL DATA:  confusion/altered mental status EXAM: CT HEAD WITHOUT CONTRAST TECHNIQUE: Contiguous axial images were obtained from the base of the skull through the vertex without intravenous contrast. COMPARISON:  None. FINDINGS: The ventricles are normal in size and configuration. Cranial mass, hemorrhage, extra-axial fluid collection, or midline shift. There is a small focus of decreased attenuation in the anterior limb of the right external capsule consistent with a small recent infarct. Elsewhere gray-white compartments are normal. Bony calvarium appears intact. The mastoid air cells are clear. IMPRESSION: Small probable acute  infarct in the anterior limb of the right external capsule. Gray-white compartments elsewhere appear normal. No hemorrhage or mass effect. Electronically Signed   By: Bretta BangWilliam  Woodruff III M.D.   On: 10/14/2015 11:07     Assessment/Plan:   34 y.o. female known history of hypertension brought to the ER by EMS with confusion. Patient was found by her father laying on the floor not acting right. When EMS arrived patient was confused and combative, brought in prone and restrained. Patient was noted to have high blood glucose as well as elevated BP, CTH R internal capsule subacute stroke.    Mental status significantly improved and close to baseline.  1) Mental status- improving. No focal deficits. Likely due to hyperglycemia which has improved on insulin gtt 2) Stroke- subacute in nature do no think contributing to current symptoms. I am not convinced she needs MRI this is due to small vessel dz/ Pt has high risk of stroke for future in the setting on uncontrolled DM, smoking and uncontrolled BP.   Pt is started on ASA 81 daily which she should take at home  S/p discussion with pt's father.  Pauletta BrownsZEYLIKMAN, Senai Kingsley  10/14/2015, 7:50 PM

## 2015-10-14 NOTE — Consult Note (Signed)
[facility] CONSULT NOTE    Date: 10/14/2015                  Patient Name:  Sabrina Davenport  MRN: [id number]  DOB: 07/22/1981  Age / Sex: 34 y.o., female         PCP: No primary care provider on file.                 Service Requesting Consult: Dr. Delfino LovettVipul Shah                 Reason for Consult: Acute renal failure            History of Present Illness: Patient is a 34 y.o. female with a PMHx of hypertension, who was [facility] 10/14/2015 for evaluation of acute renal failure in the setting of newly diagnosed uncontrolled diabetes mellitus type 2.  Initial blood sugar here was 801. She has not seen a physician in years. Her only past medical history is hypertension. Her baseline creatinine is currently unknown. She presents now with a creatinine of 2.05. She is currently on IV fluid hydration as well as insulin drip. She is also quite confused at present. She was found on the floor by her father. Once EMS arrived she was also found to be combative. In addition her blood pressure was quite high upon presentation. She was initially started on nifedipine drip have her blood pressure dropped and discontinued.   Medications: Outpatient medications: No prescriptions prior to admission    Current medications: Current Facility-Administered Medications  Medication Dose Route Frequency Provider Last Rate Last Dose  . 0.9 %  sodium chloride infusion   Intravenous Continuous Delfino LovettVipul Shah, MD 100 mL/hr at 10/14/15 1641    . acetaminophen (TYLENOL) tablet 650 mg  650 mg Oral Q6H PRN Delfino LovettVipul Shah, MD       Or  . acetaminophen (TYLENOL) suppository 650 mg  650 mg Rectal Q6H PRN Delfino LovettVipul Shah, MD      . alum & mag hydroxide-simeth (MAALOX/MYLANTA) 200-200-20 MG/5ML suspension 30 mL  30 mL Oral Q6H PRN Delfino LovettVipul Shah, MD      . antiseptic oral rinse (CPC / CETYLPYRIDINIUM CHLORIDE 0.05%) solution 7 mL  7 mL Mouth Rinse q12n4p Vipul Shah, MD   7 mL at 10/14/15 1645  .  bisacodyl (DULCOLAX) EC tablet 5 mg  5 mg Oral Daily PRN Delfino LovettVipul Shah, MD      . chlorhexidine (PERIDEX) 0.12 % solution 15 mL  15 mL Mouth Rinse BID Vipul Shah, MD      . docusate sodium (COLACE) capsule 100 mg  100 mg Oral BID Delfino LovettVipul Shah, MD   100 mg at 10/14/15 1545  . heparin injection 5,000 Units  5,000 Units Subcutaneous 3 times per day Delfino LovettVipul Shah, MD   5,000 Units at 10/14/15 1648  . HYDROcodone-acetaminophen (NORCO/VICODIN) 5-325 MG per tablet 1-2 tablet  1-2 tablet Oral Q4H PRN Delfino LovettVipul Shah, MD      . Melene Muller[START ON 10/15/2015] Influenza vac split quadrivalent PF (FLUARIX) injection 0.5 mL  0.5 mL Intramuscular Tomorrow-1000 Vipul Shah, MD      . insulin regular (NOVOLIN R,HUMULIN R) 250 Units in sodium chloride 0.9 % 250 mL (1 Units/mL) infusion   Intravenous Continuous Emily FilbertJonathan E Williams, MD 11.4 mL/hr at 10/14/15 1523 11.4 Units/hr at 10/14/15 1523  . insulin regular (NOVOLIN R,HUMULIN R) 250 Units in sodium chloride 0.9 % 250 mL (1 Units/mL) infusion   Intravenous  Continuous Delfino Lovett, MD 7.6 mL/hr at 10/14/15 1717 7.6 Units/hr at 10/14/15 1717  . nicardipine (CARDENE)  in 0.86% saline IV infusion (0.1 mg/ml)  3-15 mg/hr Intravenous Continuous Emily Filbert, MD   0 mg/hr at 10/14/15 1134  . ondansetron (ZOFRAN) tablet 4 mg  4 mg Oral Q6H PRN Delfino Lovett, MD       Or  . ondansetron (ZOFRAN) injection 4 mg  4 mg Intravenous Q6H PRN Delfino Lovett, MD      . Melene Muller ON 10/15/2015] pneumococcal 23 valent vaccine (PNU-IMMUNE) injection 0.5 mL  0.5 mL Intramuscular Tomorrow-1000 Vipul Shah, MD      . sodium chloride 0.9 % injection 3 mL  3 mL Intravenous Q12H Delfino Lovett, MD   3 mL at 10/14/15 1545  . traZODone (DESYREL) tablet 25 mg  25 mg Oral QHS PRN Delfino Lovett, MD          Allergies: No Known Allergies    Past Medical History: Past Medical History  Diagnosis Date  . Hypertension      Past Surgical History: History reviewed. No pertinent past surgical history.   Family  History: Family History  Problem Relation Age of Onset  . Hypertension Mother      Social History: Social History   Social History  . Marital Status: Single    Spouse Name: N/A  . Number of Children: N/A  . Years of Education: N/A   Occupational History  . Not on file.   Social History Main Topics  . Smoking status: Current Every Day Smoker -- 0.50 packs/day for 10 years    Types: Cigarettes  . Smokeless tobacco: Not on file  . Alcohol Use: No  . Drug Use: Not on file  . Sexual Activity: Not on file   Other Topics Concern  . Not on file   Social History Narrative     Review of Systems: Patient confused and unable to offer accurate review of systems.   Vital Signs: Blood pressure 148/104, pulse 114, temperature 97 F (36.1 C), temperature source Axillary, resp. rate 26, height  (1.727 m), weight 101 kg (222 lb 10.6 oz), SpO2 98 %.  Weight trends: Filed Weights   10/14/15 0859 10/14/15 1631  Weight: 126.1 kg (278 lb) 101 kg (222 lb 10.6 oz)    Physical Exam: General: Critically ill appaering  Head: Normocephalic, atraumatic.  Eyes: Anicteric, EOMI  Nose: Mucous membranes dry, not inflammed, nonerythematous.  Oral Cavity: Oral mucosa dry   Neck: Supple trachea midline  Lungs:  Normal respiratory effort. Clear to auscultation BL without crackles or wheezes.  Heart: RRR. S1 and S2 normal without gallop, murmur, or rubs.  Abdomen:  BS normoactive. Soft, Nondistended, non-tender.  No masses or organomegaly.  Extremities: No pretibial edema.  Neurologic: Awake but confused at present, will follow a few simple commands  Skin: No visible rashes, scars.    Lab results: Basic Metabolic Panel:  Recent Labs Lab 10/14/15 0905  NA 131*  K 3.2*  CL 96*  CO2 24  GLUCOSE 801*  BUN 21*  CREATININE 2.05*  CALCIUM 9.5    Liver Function Tests:  Recent Labs Lab 10/14/15 0905  AST 22  ALT 16  ALKPHOS 101  BILITOT 0.5  PROT 9.0*  ALBUMIN 4.4   No  results for input(s): LIPASE, AMYLASE in the last 168 hours. No results for input(s): AMMONIA in the last 168 hours.  CBC:  Recent Labs Lab 10/14/15 0905  WBC 15.2*  NEUTROABS 12.8*  HGB 15.0  HCT 47.3*  MCV 74.9*  PLT 305    Cardiac Enzymes:  Recent Labs Lab 10/14/15 0905  TROPONINI 0.04*    BNP: Invalid input(s): POCBNP  CBG:  Recent Labs Lab 10/14/15 1152 10/14/15 1300 10/14/15 1419 10/14/15 1518 10/14/15 1614  GLUCAP 515*  515* 466* 404* 345* 269*    Microbiology: No results found for this or any previous visit.  Coagulation Studies: No results for input(s): LABPROT, INR in the last 72 hours.  Urinalysis:  Recent Labs  10/14/15 0906  COLORURINE STRAW*  LABSPEC 1.021  PHURINE 6.0  GLUCOSEU >500*  HGBUR 1+*  BILIRUBINUR NEGATIVE  KETONESUR NEGATIVE  PROTEINUR 100*  NITRITE NEGATIVE  LEUKOCYTESUR NEGATIVE      Imaging: Ct Head Wo Contrast  10/14/2015  CLINICAL DATA:  confusion/altered mental status EXAM: CT HEAD WITHOUT CONTRAST TECHNIQUE: Contiguous axial images were obtained from the base of the skull through the vertex without intravenous contrast. COMPARISON:  None. FINDINGS: The ventricles are normal in size and configuration. Cranial mass, hemorrhage, extra-axial fluid collection, or midline shift. There is a small focus of decreased attenuation in the anterior limb of the right external capsule consistent with a small recent infarct. Elsewhere gray-white compartments are normal. Bony calvarium appears intact. The mastoid air cells are clear. 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 Gray-white compartments elsewhere appear normal. No hemorrhage or mass effect. Electronically Signed   By: Bretta Bang III M.D.   On: 10/14/2015 11:07      Assessment & Plan: Pt is a 34 y.o. yo female with a PMHX of hypertension, was admitted to [facility] on 10/14/2015 with newly diagnosed diabetes mellitus  type 2, altered mental status, acute renal failure, hypokalemia.  1.  Acute renal failure due to ATN. 2.  Proteinuria. 3.  Hypokalemia. 4.  Hypertension. 5.  Newly diagnosed diabetes mellitus type 2.   Plan:   The patient appears to be quite ill at this point in time. She appears to have a hyperosmolar state. She has acute renal failure secondary to acute tubular necrosis. We will proceed with renal ultrasound. We will continue to monitor urine output closely. In terms of management continue 0.9 normal saline however we will supplement this with 40 mEq of potassium chloride as her potassium is likely to further drop given the insulin drip. She will continue on the insulin drip per protocol. No indication for hemodialysis at this point in time. We would certainly avoid any further nephrotoxins and IV contrast at this time. In regards to hypertension, continue to monitor blood pressure closely.  Can consider PRN hydralzine for elevated blood pressure, versus going back on nifedipine at a lower dose.  Patient's mother was updated on the patient's condition. Thank you for consultation.

## 2015-10-15 ENCOUNTER — Inpatient Hospital Stay: Payer: MEDICAID

## 2015-10-15 ENCOUNTER — Inpatient Hospital Stay: Payer: Self-pay

## 2015-10-15 LAB — COMPREHENSIVE METABOLIC PANEL
ALBUMIN: 3.5 g/dL (ref 3.5–5.0)
ALBUMIN: 3.6 g/dL (ref 3.5–5.0)
ALBUMIN: 3.7 g/dL (ref 3.5–5.0)
ALK PHOS: 65 U/L (ref 38–126)
ALK PHOS: 67 U/L (ref 38–126)
ALK PHOS: 68 U/L (ref 38–126)
ALT: 13 U/L — AB (ref 14–54)
ALT: 13 U/L — AB (ref 14–54)
ALT: 13 U/L — AB (ref 14–54)
ALT: 14 U/L (ref 14–54)
ALT: 14 U/L (ref 14–54)
ALT: 15 U/L (ref 14–54)
ANION GAP: 9 (ref 5–15)
ANION GAP: 8 (ref 5–15)
ANION GAP: 8 (ref 5–15)
AST: 15 U/L (ref 15–41)
AST: 15 U/L (ref 15–41)
AST: 19 U/L (ref 15–41)
AST: 21 U/L (ref 15–41)
AST: 21 U/L (ref 15–41)
AST: 23 U/L (ref 15–41)
Albumin: 3.5 g/dL (ref 3.5–5.0)
Albumin: 3.4 g/dL — ABNORMAL LOW (ref 3.5–5.0)
Albumin: 3.5 g/dL (ref 3.5–5.0)
Alkaline Phosphatase: 67 U/L (ref 38–126)
Alkaline Phosphatase: 68 U/L (ref 38–126)
Alkaline Phosphatase: 71 U/L (ref 38–126)
Anion gap: 5 (ref 5–15)
Anion gap: 6 (ref 5–15)
Anion gap: 6 (ref 5–15)
BILIRUBIN TOTAL: 0.5 mg/dL (ref 0.3–1.2)
BILIRUBIN TOTAL: 0.6 mg/dL (ref 0.3–1.2)
BILIRUBIN TOTAL: 0.6 mg/dL (ref 0.3–1.2)
BILIRUBIN TOTAL: 0.7 mg/dL (ref 0.3–1.2)
BUN: 16 mg/dL (ref 6–20)
BUN: 17 mg/dL (ref 6–20)
BUN: 17 mg/dL (ref 6–20)
BUN: 17 mg/dL (ref 6–20)
BUN: 18 mg/dL (ref 6–20)
BUN: 18 mg/dL (ref 6–20)
CALCIUM: 8.8 mg/dL — AB (ref 8.9–10.3)
CALCIUM: 8.6 mg/dL — AB (ref 8.9–10.3)
CALCIUM: 9.2 mg/dL (ref 8.9–10.3)
CHLORIDE: 105 mmol/L (ref 101–111)
CHLORIDE: 108 mmol/L (ref 101–111)
CO2: 24 mmol/L (ref 22–32)
CO2: 24 mmol/L (ref 22–32)
CO2: 24 mmol/L (ref 22–32)
CO2: 24 mmol/L (ref 22–32)
CO2: 25 mmol/L (ref 22–32)
CO2: 26 mmol/L (ref 22–32)
CREATININE: 1.65 mg/dL — AB (ref 0.44–1.00)
CREATININE: 1.7 mg/dL — AB (ref 0.44–1.00)
CREATININE: 1.88 mg/dL — AB (ref 0.44–1.00)
CREATININE: 1.93 mg/dL — AB (ref 0.44–1.00)
Calcium: 8.9 mg/dL (ref 8.9–10.3)
Calcium: 9 mg/dL (ref 8.9–10.3)
Calcium: 9 mg/dL (ref 8.9–10.3)
Chloride: 109 mmol/L (ref 101–111)
Chloride: 106 mmol/L (ref 101–111)
Chloride: 107 mmol/L (ref 101–111)
Chloride: 108 mmol/L (ref 101–111)
Creatinine, Ser: 2 mg/dL — ABNORMAL HIGH (ref 0.44–1.00)
Creatinine, Ser: 1.78 mg/dL — ABNORMAL HIGH (ref 0.44–1.00)
GFR calc Af Amer: 36 mL/min — ABNORMAL LOW (ref >60)
GFR calc Af Amer: 46 mL/min — ABNORMAL LOW (ref >60)
GFR calc non Af Amer: 31 mL/min — ABNORMAL LOW (ref >60)
GFR calc non Af Amer: 36 mL/min — ABNORMAL LOW (ref >60)
GFR calc non Af Amer: 39 mL/min — ABNORMAL LOW (ref >60)
GFR, EST AFRICAN AMERICAN: 44 mL/min — AB (ref >60)
GFR, EST AFRICAN AMERICAN: 39 mL/min — AB (ref >60)
GFR, EST AFRICAN AMERICAN: 38 mL/min — AB (ref >60)
GFR, EST AFRICAN AMERICAN: 42 mL/min — AB (ref >60)
GFR, EST NON AFRICAN AMERICAN: 38 mL/min — AB (ref >60)
GFR, EST NON AFRICAN AMERICAN: 34 mL/min — AB (ref >60)
GFR, EST NON AFRICAN AMERICAN: 33 mL/min — AB (ref >60)
GLUCOSE: 159 mg/dL — AB (ref 65–99)
GLUCOSE: 237 mg/dL — AB (ref 65–99)
Glucose, Bld: 115 mg/dL — ABNORMAL HIGH (ref 65–99)
Glucose, Bld: 221 mg/dL — ABNORMAL HIGH (ref 65–99)
Glucose, Bld: 254 mg/dL — ABNORMAL HIGH (ref 65–99)
Glucose, Bld: 100 mg/dL — ABNORMAL HIGH (ref 65–99)
POTASSIUM: 3 mmol/L — AB (ref 3.5–5.1)
POTASSIUM: 3.5 mmol/L (ref 3.5–5.1)
POTASSIUM: 3.6 mmol/L (ref 3.5–5.1)
Potassium: 3.5 mmol/L (ref 3.5–5.1)
Potassium: 3.4 mmol/L — ABNORMAL LOW (ref 3.5–5.1)
Potassium: 3.2 mmol/L — ABNORMAL LOW (ref 3.5–5.1)
SODIUM: 139 mmol/L (ref 135–145)
SODIUM: 139 mmol/L (ref 135–145)
SODIUM: 140 mmol/L (ref 135–145)
Sodium: 135 mmol/L (ref 135–145)
Sodium: 139 mmol/L (ref 135–145)
Sodium: 140 mmol/L (ref 135–145)
TOTAL PROTEIN: 6.9 g/dL (ref 6.5–8.1)
TOTAL PROTEIN: 7.1 g/dL (ref 6.5–8.1)
TOTAL PROTEIN: 7.4 g/dL (ref 6.5–8.1)
Total Bilirubin: 0.4 mg/dL (ref 0.3–1.2)
Total Bilirubin: 0.7 mg/dL (ref 0.3–1.2)
Total Protein: 7 g/dL (ref 6.5–8.1)
Total Protein: 7.2 g/dL (ref 6.5–8.1)
Total Protein: 7.3 g/dL (ref 6.5–8.1)

## 2015-10-15 LAB — PROTIME-INR
INR: 1.17
Prothrombin Time: 15.1 seconds — ABNORMAL HIGH (ref 11.4–15.0)

## 2015-10-15 LAB — GLUCOSE, CAPILLARY
GLUCOSE-CAPILLARY: 106 mg/dL — AB (ref 65–99)
GLUCOSE-CAPILLARY: 227 mg/dL — AB (ref 65–99)
GLUCOSE-CAPILLARY: 348 mg/dL — AB (ref 65–99)
GLUCOSE-CAPILLARY: 225 mg/dL — AB (ref 65–99)
GLUCOSE-CAPILLARY: 271 mg/dL — AB (ref 65–99)
Glucose-Capillary: 101 mg/dL — ABNORMAL HIGH (ref 65–99)
Glucose-Capillary: 114 mg/dL — ABNORMAL HIGH (ref 65–99)
Glucose-Capillary: 171 mg/dL — ABNORMAL HIGH (ref 65–99)
Glucose-Capillary: 189 mg/dL — ABNORMAL HIGH (ref 65–99)
Glucose-Capillary: 193 mg/dL — ABNORMAL HIGH (ref 65–99)
Glucose-Capillary: 209 mg/dL — ABNORMAL HIGH (ref 65–99)
Glucose-Capillary: 221 mg/dL — ABNORMAL HIGH (ref 65–99)
Glucose-Capillary: 231 mg/dL — ABNORMAL HIGH (ref 65–99)
Glucose-Capillary: 291 mg/dL — ABNORMAL HIGH (ref 65–99)
Glucose-Capillary: 312 mg/dL — ABNORMAL HIGH (ref 65–99)

## 2015-10-15 LAB — LIPID PANEL
Cholesterol: 162 mg/dL (ref 0–200)
HDL: 39 mg/dL — AB (ref >40)
LDL CALC: 101 mg/dL — AB (ref 0–99)
TRIGLYCERIDES: 112 mg/dL (ref <150)
Total CHOL/HDL Ratio: 4.2 RATIO
VLDL: 22 mg/dL (ref 0–40)

## 2015-10-15 LAB — CBC
HEMATOCRIT: 38 % (ref 35.0–47.0)
HEMOGLOBIN: 12.4 g/dL (ref 12.0–16.0)
MCH: 24 pg — AB (ref 26.0–34.0)
MCHC: 32.8 g/dL (ref 32.0–36.0)
MCV: 73.2 fL — AB (ref 80.0–100.0)
Platelets: 246 10*3/uL (ref 150–440)
RBC: 5.19 MIL/uL (ref 3.80–5.20)
RDW: 13.7 % (ref 11.5–14.5)
WBC: 16.7 10*3/uL — ABNORMAL HIGH (ref 3.6–11.0)

## 2015-10-15 LAB — HEMOGLOBIN A1C
Hgb A1c MFr Bld: 10.4 % — ABNORMAL HIGH (ref 4.0–6.0)
Hgb A1c MFr Bld: 10.2 % — ABNORMAL HIGH (ref 4.0–6.0)

## 2015-10-15 LAB — MAGNESIUM: Magnesium: 2.1 mg/dL (ref 1.7–2.4)

## 2015-10-15 LAB — APTT: aPTT: 27 seconds (ref 24–36)

## 2015-10-15 LAB — MRSA PCR SCREENING: MRSA by PCR: NEGATIVE

## 2015-10-15 MED ORDER — INSULIN ASPART 100 UNIT/ML ~~LOC~~ SOLN
3.0000 [IU] | Freq: Three times a day (TID) | SUBCUTANEOUS | Status: DC
  Administered 2015-10-15: 6 [IU] via SUBCUTANEOUS
  Administered 2015-10-16: 3 [IU] via SUBCUTANEOUS
  Filled 2015-10-15: qty 3
  Filled 2015-10-15: qty 6

## 2015-10-15 MED ORDER — INSULIN REGULAR HUMAN 100 UNIT/ML IJ SOLN: 15.0000 [IU] | Freq: Three times a day (TID) | INTRAMUSCULAR | Status: DC

## 2015-10-15 MED ORDER — INSULIN REGULAR HUMAN 100 UNIT/ML IJ SOLN: 3.0000 [IU] | Freq: Four times a day (QID) | INTRAMUSCULAR | Status: DC | PRN

## 2015-10-15 MED ORDER — INSULIN DETEMIR 100 UNIT/ML ~~LOC~~ SOLN
25.0000 [IU] | Freq: Two times a day (BID) | SUBCUTANEOUS | Status: DC
  Administered 2015-10-15 – 2015-10-16 (×3): 25 [IU] via SUBCUTANEOUS
  Filled 2015-10-15 (×4): qty 0.25

## 2015-10-15 MED ORDER — INSULIN NPH (HUMAN) (ISOPHANE) 100 UNIT/ML ~~LOC~~ SUSP: 25.0000 [IU] | Freq: Two times a day (BID) | SUBCUTANEOUS | Status: DC

## 2015-10-15 MED ORDER — INSULIN REGULAR HUMAN 100 UNIT/ML IJ SOLN: 20.0000 [IU] | Freq: Three times a day (TID) | INTRAMUSCULAR | Status: DC

## 2015-10-15 MED ORDER — LIVING WELL WITH DIABETES BOOK
Freq: Once | Status: AC
  Administered 2015-10-15: 10:00:00
  Filled 2015-10-15: qty 1

## 2015-10-15 MED ORDER — CARVEDILOL 6.25 MG PO TABS
6.2500 mg | ORAL_TABLET | Freq: Two times a day (BID) | ORAL | Status: DC
  Administered 2015-10-15 – 2015-10-16 (×2): 6.25 mg via ORAL
  Filled 2015-10-15 (×2): qty 1

## 2015-10-15 MED ORDER — INSULIN NPH (HUMAN) (ISOPHANE) 100 UNIT/ML ~~LOC~~ SUSP: 15.0000 [IU] | Freq: Once | SUBCUTANEOUS | Status: DC

## 2015-10-15 MED ORDER — INSULIN ASPART 100 UNIT/ML ~~LOC~~ SOLN: 3.0000 [IU] | Freq: Every day | SUBCUTANEOUS | Status: DC

## 2015-10-15 MED ORDER — AMLODIPINE BESYLATE 5 MG PO TABS
5.0000 mg | ORAL_TABLET | Freq: Every day | ORAL | Status: DC
  Administered 2015-10-15: 5 mg via ORAL
  Filled 2015-10-15: qty 1

## 2015-10-15 MED ORDER — INSULIN ASPART 100 UNIT/ML ~~LOC~~ SOLN
20.0000 [IU] | Freq: Three times a day (TID) | SUBCUTANEOUS | Status: DC
  Administered 2015-10-15 – 2015-10-17 (×5): 20 [IU] via SUBCUTANEOUS
  Filled 2015-10-15 (×6): qty 20

## 2015-10-15 MED ORDER — HYDRALAZINE HCL 20 MG/ML IJ SOLN
10.0000 mg | INTRAMUSCULAR | Status: DC | PRN
  Administered 2015-10-15 – 2015-10-16 (×5): 10 mg via INTRAVENOUS
  Filled 2015-10-15 (×5): qty 1

## 2015-10-15 MED ORDER — INSULIN DETEMIR 100 UNIT/ML ~~LOC~~ SOLN
15.0000 [IU] | Freq: Once | SUBCUTANEOUS | Status: AC
  Administered 2015-10-15: 15 [IU] via SUBCUTANEOUS
  Filled 2015-10-15: qty 0.15

## 2015-10-15 MED ORDER — INSULIN STARTER KIT- SYRINGES (ENGLISH)
1.0000 | Freq: Once | Status: AC
  Administered 2015-10-15: 1
  Filled 2015-10-15: qty 1

## 2015-10-15 NOTE — Progress Notes (Signed)
[facility]  ROUNDING NOTE   Subjective:  Patient much better this AM. Hyperglycemia resolved. Completely awake, alert, oriented now. Renal function improved. Could have underlying CKD as well.    Objective:  Vital signs in last 24 hours:  Temp:  [97.8 F (36.6 C)-98.8 F (37.1 C)] 97.8 F (36.6 C) (10/28 0800) Pulse Rate:  [88-125] 97 (10/28 0800) Resp:  [14-43] 25 (10/28 0800) BP: (126-239)/(83-169) 157/108 mmHg (10/28 0800) SpO2:  [93 %-100 %] 100 % (10/28 0800) FiO2 (%):  [21 %] 21 % (10/27 1623) Weight:  [101 kg (222 lb 10.6 oz)] 101 kg (222 lb 10.6 oz) (10/27 1631)  Weight change:  Filed Weights   10/14/15 0859 10/14/15 1631  Weight: 126.1 kg (278 lb) 101 kg (222 lb 10.6 oz)    Intake/Output: I/O last 3 completed shifts: In: 1703.4 [I.V.:1303.4; IV Piggyback:400] Out: -    Intake/Output this shift:     Physical Exam: General: NAD, awake, alert.  Head: Normocephalic, atraumatic. Moist oral mucosal membranes  Eyes: Anicteric  Neck: Supple, trachea midline  Lungs:  Clear to auscultation normal effort  Heart: Regular rate and rhythm  Abdomen:  Soft, nontender, BS present  Extremities: no peripheral edema.  Neurologic: Nonfocal, moving all four extremities  Skin: No lesions       Basic Metabolic Panel:  Recent Labs Lab 10/14/15 0905 10/14/15 1849 10/14/15 2352 10/15/15 0340 10/15/15 0802  NA 131* 142 139 140 139  K 3.2* 3.1* 3.0* 3.5 3.4*  CL 96* 105 108 109 106  CO2 _0 GLUCOSE 801* 150* 159* 115* 221*  BUN 21* _1 CREATININE 2.05* 1.56* 1.65* 1.70* 1.88*  CALCIUM 9.5 9.4 9.0 8.9 8.8*  MG  --   --   --  2.1  --     Liver Function Tests:  Recent Labs Lab 10/14/15 0905 10/14/15 1849 10/14/15 2352 10/15/15 0340 10/15/15 0802  AST _2 ALT 16 14 13* 13* 13*  ALKPHOS 101 86 71 68 65  BILITOT 0.5 0.3 0.5 0.7 0.4  PROT 9.0* 8.4* 7.4 7.1 7.0  ALBUMIN 4.4 4.1 3.7 3.5 3.4*   No results for  input(s): LIPASE, AMYLASE in the last 168 hours. No results for input(s): AMMONIA in the last 168 hours.  CBC:  Recent Labs Lab 10/14/15 0905 10/15/15 0340  WBC 15.2* 16.7*  NEUTROABS 12.8*  --   HGB 15.0 12.4  HCT 47.3* 38.0  MCV 74.9* 73.2*  PLT 305 246    Cardiac Enzymes:  Recent Labs Lab 10/14/15 0905  TROPONINI 0.04*    BNP: Invalid input(s): POCBNP  CBG:  Recent Labs Lab 10/15/15 0301 10/15/15 0525 10/15/15 0630 10/15/15 0729 10/15/15 0734  GLUCAP 101* 171* 189* 209* 221*    Microbiology: No results found for this or any previous visit.  Coagulation Studies:  Recent Labs  10/15/15 0340  LABPROT 15.1*  INR 1.17    Urinalysis:  Recent Labs  10/14/15 0906  COLORURINE STRAW*  LABSPEC 1.021  PHURINE 6.0  GLUCOSEU >500*  HGBUR 1+*  BILIRUBINUR NEGATIVE  KETONESUR NEGATIVE  PROTEINUR 100*  NITRITE NEGATIVE  LEUKOCYTESUR NEGATIVE      Imaging: Ct Head Wo Contrast  10/14/2015  CLINICAL DATA:  confusion/altered mental status EXAM: CT HEAD WITHOUT CONTRAST TECHNIQUE: Contiguous axial images were obtained from the base of the skull through the vertex without intravenous contrast. COMPARISON:  None. FINDINGS: The ventricles are normal in size  and configuration. Cranial mass, hemorrhage, extra-axial fluid collection, or midline shift. There is a small focus of decreased attenuation in the anterior limb of the right external capsule consistent with a small recent infarct. Elsewhere gray-white compartments are normal. Bony calvarium appears intact. The mastoid air cells are clear. 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 Gray-white compartments elsewhere appear normal. No hemorrhage or mass effect. Electronically Signed   By: Lowella Grip III M.D.   On: 10/14/2015 11:07     Medications:   . dexmedetomidine 0.1 mcg/kg/hr (10/15/15 0829)  . dextrose 5 % and 0.45 % NaCl with KCl 40 mEq/L 100  mL/hr at 10/14/15 1927  . insulin (NOVOLIN-R) infusion 7.6 Units/hr (10/14/15 1717)  . insulin (NOVOLIN-R) infusion 1.7 Units/hr (10/15/15 0829)  . niCARDipine     . antiseptic oral rinse  7 mL Mouth Rinse q12n4p  . aspirin  81 mg Oral Daily  . atorvastatin  40 mg Oral q1800  . chlorhexidine  15 mL Mouth Rinse BID  . docusate sodium  100 mg Oral BID  . heparin  5,000 Units Subcutaneous 3 times per day  . Influenza vac split quadrivalent PF  0.5 mL Intramuscular Tomorrow-1000  . insulin starter kit- syringes  1 kit Other Once  . living well with diabetes book   Does not apply Once  . pneumococcal 23 valent vaccine  0.5 mL Intramuscular Tomorrow-1000  . sodium chloride  3 mL Intravenous Q12H   acetaminophen **OR** acetaminophen, alum & mag hydroxide-simeth, bisacodyl, hydrALAZINE, HYDROcodone-acetaminophen, labetalol, ondansetron **OR** ondansetron (ZOFRAN) IV, traZODone  Assessment/ Plan:  34 y.o. female  with a PMHX of hypertension, was admitted to [facility] on 10/14/2015 with newly diagnosed diabetes mellitus type 2, altered mental status, acute renal failure, hypokalemia.  1. Acute renal failure due to ATN. 2. Proteinuria. 3. Hypokalemia. 4. Hypertension. 5. Newly diagnosed diabetes mellitus type 2.   Plan:  Patient much improved today.  Completely awake and alert at the moment.  Renal function improved however, pt could poltentially have an element of CKD.  Blood pressure still quite high.  She states she used to take antihypertensives in the past.  Will add amlodipine 69m po daily as well as coreg 6.275mpo BID at this time.  Will consider ACEi/ARB once we know where Cr settles out at.  Will also obtain renal [place].  She will certainly require nutritional information regarding her new onset diabetes mellitus.   LOS: 1 Sabrina Davenport 10/28/20169:23 AM

## 2015-10-15 NOTE — Evaluation (Signed)
Clinical/Bedside Swallow Evaluation Patient Details  Name: Sabrina Davenport MRN: [id number] Date of Birth: 15-Mar-1981  Today's Date: 10/15/2015 Time: SLP Start Time (ACUTE ONLY): 0840 SLP Stop Time (ACUTE ONLY): 0940 SLP Time Calculation (min) (ACUTE ONLY): 60 min  Past Medical History:  Past Medical History  Diagnosis Date  . Hypertension    Past Surgical History: History reviewed. No pertinent past surgical history. HPI:  Pt is a 34 y.o. female with a known history of hypertension brought to the ER by EMS with confusion. Patient was found by her father laying on the floor not acting right. When EMS arrived patient was confused and combative, brought in prone and restrained. Patient was noted to have high blood glucose noted by EMS. Patient was also noted to have blood in and around her mouth, concerned she may have had a seizure. Patient reportedly does not have a seizure history. Pt is awake, slightly drowsy/groggy intermittently but conversive and followed instructions(NSG felt this presentation was d/t her current Meds). Pt indicated her wants/needs appropriately to SLP and NSG.    Assessment / Plan / Recommendation Clinical Impression  Pt appears at reduced risk for aspiration w/ trials of po's at this time following general aspiration precautions. She tolerated mech soft/regular trials w/ thin liquids w/ no overt s/s of aspiration noted; no oral phase deficits noted. Pt presented w/ drowsiness intermittently but responded well to verbal cues to complete trials. Pt required min assist w/ setup of foods/liquids as well. Due to pt's current overall weakness, rec. a mech soft diet w/ thin liquids w/ aspiration precautions and assistance at meals/monitoring for any nec. support/cues d/t current Meds. Rec. meds in puree if any trouble swallowing w/ liquids. ST will f/u next 1-3 days if any decline in current status is noted. NSG updated and agreed.     Aspiration Risk   (reduced)    Diet  Recommendation Dysphagia 3 (Mech soft);Thin   Medication Administration: Whole meds with liquid Compensations: Slow rate;Small sips/bites    Other  Recommendations Recommended Consults:  (n/a) Oral Care Recommendations: Oral care BID;Patient independent with oral care Other Recommendations:  (n/a)   Follow Up Recommendations       Frequency and Duration        Pertinent Vitals/Pain denied    SLP Swallow Goals  n/a at this time    Swallow Study Prior Functional Status   resides at home; independent w/ her own care. Denied any dysphagia prior.    General Date of Onset: 10/14/15 Other Pertinent Information: Pt is a 34 y.o. female with a known history of hypertension brought to the ER by EMS with confusion. Patient was found by her father laying on the floor not acting right. When EMS arrived patient was confused and combative, brought in prone and restrained. Patient was noted to have high blood glucose noted by EMS. Patient was also noted to have blood in and around her mouth, concerned she may have had a seizure. Patient reportedly does not have a seizure history. Pt is awake, slightly drowsy/groggy intermittently but conversive and followed instructions(NSG felt this presentation was d/t her current Meds). Pt indicated her wants/needs appropriately to SLP and NSG.  Type of Study: Bedside swallow evaluation Previous Swallow Assessment: none; pt reported she ate a regular diet w/ no problems Diet Prior to this Study: Regular;Thin liquids Temperature Spikes Noted: No (wbc elevated) Respiratory Status: Room air History of Recent Intubation: No Behavior/Cognition: Alert;Cooperative;Pleasant mood;Requires cueing (slightly drowsy) Oral Cavity - Dentition: Adequate  natural dentition/normal for age Self-Feeding Abilities: Able to feed self;Needs set up Patient Positioning: Upright in bed Baseline Vocal Quality: Normal;Low vocal intensity Volitional Cough: Strong Volitional Swallow: Able  to elicit    Oral/Motor/Sensory Function Overall Oral Motor/Sensory Function: Appears within functional limits for tasks assessed Labial ROM: Within Functional Limits Labial Symmetry: Within Functional Limits Labial Strength: Within Functional Limits Lingual Symmetry: Within Functional Limits Lingual Strength: Within Functional Limits Lingual Sensation: Within Functional Limits Facial Symmetry: Within Functional Limits Mandible: Within Functional Limits   Ice Chips Ice chips: Within functional limits Presentation: Cup;Spoon (fed ) Other Comments: 2 trials   Thin Liquid Thin Liquid: Within functional limits Presentation: Cup;Self Fed;Straw Other Comments: ~8 ozs    Nectar Thick Nectar Thick Liquid: Not tested   Honey Thick Honey Thick Liquid: Not tested   Puree Puree: Within functional limits Presentation: Self Fed;Spoon Other Comments: 3-4 trials   Solid   GO    Solid: Within functional limits Presentation: Self Fed Other Comments: 6 trials      Jerilynn Som, MS, CCC-SLP  Sabrina Davenport 10/15/2015,11:04 AM

## 2015-10-15 NOTE — Care Management Note (Addendum)
Case Management Note  Patient Details  Name: Sabrina FurbishJennifer R Davenport MRN: [id number] Date of Birth: 03-02-1981  Subjective/Objective:   Case management  Consult received on 34 year old female with new onset DM II. Patient has no PCP or insurance. Attempted to evaluate patient and diabetes coordinator is in with patient.  Will re attempt at a later time. (Patient will need to be be prescribed the generic for Novolog [facility]            Action/Plan:  RNCM assessment for new onset DM requiring insulin.   Expected Discharge Date:                  Expected Discharge Plan:     In-House Referral:     Discharge planning Services  CM Consult  Post Acute Care Choice:    Choice offered to:     DME Arranged:    DME Agency:     HH Arranged:    HH Agency:     Status of Service:  In process, will continue to follow  Medicare Important Message Given:    Date Medicare IM Given:    Medicare IM give by:    Date Additional Medicare IM Given:    Additional Medicare Important Message give by:     If discussed at Long Length of Stay Meetings, dates discussed:    Additional Comments:  Sabrina MemosLisa M Kaylob Wallen, RN 10/15/2015, 12:08 PM

## 2015-10-15 NOTE — Progress Notes (Signed)
Paged elink concerning pt's bp.not responding adequately to labetalol.  Hydralazine at q6hrs.  MD changed prn hydralazine to q2hrs.

## 2015-10-15 NOTE — Progress Notes (Addendum)
Received consult for new onset DM. Patient was admitted with [place] and started on an insulin drip. In reviewing the chart this morning, noted that insulin drip was stopped at 5:55 am this morning and that CBGs were still being monitored Q1H. Maryruth Eve, RN regarding insulin drip and he reported that the night RN had informed him that per Dr. Delorise Shiner recommendation blood glucose target goal was 200-300 mg/dl and patient's glucose was 221 mg/dl at 7:34 am this morning. Recommended insulin drip be restarted per GlucoStabilizer which has target glucose of 140-180 mg/dl. Adam, RN reported that he would discuss recommendation with MD. Quita Skye, RN also reported that patient was alert but drowsy due to Precedex. Ordered insulin starter kit, Living Well with Diabetes booklet, RD consult, and CM consult.   Spoke with patient and both of her parents about new diabetes diagnosis. Reviewed Living Well with Diabetes booklet and discussed basic pathophysiology of DM Type 2, basic home care, importance of checking CBGs and maintaining good CBG control to prevent long-term and short-term complications. Discussed normal glucose values, A1C, and A1C target of 7% or less. Informed patient that her A1C was not in the chart yet but she should RN ask what it was if she has not been told prior to discharge. Reviewed signs and symptoms of hyperglycemia and hypoglycemia along with treatment for both. Asked patient to check glucose 4 times per day (before meals and at bedtime) and any other time she felt glucose was high or low. Asked patient to keep a log book of glucose readings and insulin taken. Explained how the doctor can use the log book to identify glucose trends and use the information to make adjustments with DM medications.  Discussed impact of nutrition, exercise, stress, sickness, and medications on diabetes control.  Discussed carbohydrates, carbohydrate goals per day and meal, along with portion sizes. Patient reports that  she drinks sweet tea, kool-aid, and juice and asked "what am I suppose to drink if I am not suppose to drink sugary drinks?"  Encouraged patient to eliminate sugary drinks and limit juice intake to 4 oz per serving. Asked patient to try to drink more water, unsweetened tea, and sugar-free kool-aid and with sugar substitue if needed or desired. Informed patient that RD consult had been ordered for further information on Carb Modified diet.  Patient reports that she does not have any insurance nor a PCP. Therefore, when she is transitioned to SQ insulin she will likely need to use 70/30, NPH, and/or Regular as an outpatient which she can purchase at [facility] for $25 per vial (Novolin 70/30, Novolin NPH, and Novolin R are all available at [facility] for $25 per vial).  Patient will also need to establish care as she does not have a PCP. Informed patient a consult was placed for Case Manager. Educated patient and her parents on insulin use at home. Reviewed contents of insulin syringe starter kit. Reviewed and demonstrated all steps of drawing up insulin, administering insulin injections SQ, disposal of sharps, storage of unused insulin, disposal of insulin etc. Patient able to provide successful return demonstration with some prompting.  Patient verbalized understanding of information discussed and she states that she has no further questions at this time related to diabetes. Patient will need frequent reinforcement of diabetes education and insulin teaching. RNs to provide ongoing basic DM education at bedside with this patient and engage patient to actively check blood glucose and self-administer insulin injections. Encourage patient to review Living Well with Diabetes booklet and  insulin starter kit and answer any questions she may have.   Patient remains on insulin drip at this time and current glucose is 271 mg/dl at 12:00. Patient is now ordered a heart health carb modified diet. Recommend ordering Novolog 0-10  units TID with meals IV per GlucoStabilizer while she is on insulin drip and eating.  Barnie Alderman, RN, MSN, CDE Diabetes Coordinator Inpatient Diabetes Program [phone] (Team Pager from [place] to [place]) [phone] ([facility] office) [phone] [facility] office) [phone] [facility] office)

## 2015-10-15 NOTE — Progress Notes (Signed)
Sabrina Davenport is a 34 y.o. female admitted with hypertensive emergency, acute renal failure, and newly diagnosed uncontrolled diabetes with hyperosmolar hyperglycemic state.  She remains on IV insulin. Sugars have been in the range of 100-330 today. Overnight, basal rate was in the 1.8 - 2.4 units/hr range. Post-meals she has hyperglycemia and requires about 10 units/hr.  She is on a low carb diet and is tolerating her diet. She has been seen by the Diabetes Coordinators and started inpatient educations. She has been instructed on self administration of insulin.  She has persistent renal insufficiency with creatinine now 1.4. She has persistent HTN which is uncontrolled. She is tolerating her BP medications.   Medical history Type 2 diabetes HTN Obesity Tobacco use  Surgical history History reviewed. No pertinent past surgical history.   Medications . amLODipine  5 mg Oral Daily  . antiseptic oral rinse  7 mL Mouth Rinse q12n4p  . aspirin  81 mg Oral Daily  . atorvastatin  40 mg Oral q1800  . carvedilol  6.25 mg Oral BID WC  . chlorhexidine  15 mL Mouth Rinse BID  . docusate sodium  100 mg Oral BID  . heparin  5,000 Units Subcutaneous 3 times per day  . insulin regular 100 units/ml IV       Social history Social History  Substance Use Topics  . Smoking status: Current Every Day Smoker -- 0.50 packs/day for 10 years    Types: Cigarettes  . Smokeless tobacco: None  . Alcohol Use: No     Family history Family History  Problem Relation Age of Onset  . Hypertension Mother      Review of systems CV: no chest pain or palpitations PULM: no cough or shortness of breath ABD: no abdominal pain or nausea or vomiting   Physical Exam BP 181/117 mmHg  Pulse 102  Temp(Src) 97.8 F (36.6 C) (Oral)  Resp 20  Ht 5' 8"  (1.727 m)  Wt 101 kg (222 lb 10.6 oz)  BMI 33.86 kg/m2  SpO2 100%  GEN: well developed, well nourished female, in NAD. HEENT: No proptosis, EOMI, lid lag  or stare. Oropharynx is clear.  NECK: supple, trachea midline.   RESPIRATORY: clear bilaterally, no wheeze, good inspiratory effort. CV: No carotid bruits, RRR. MUSCULOSKELETAL: normal gait and station, no clubbing, no tremor ABD: soft, NT/ND, no organomegaly EXT: no peripheral edema SKIN: no dermatopathy or rash or acanthosis nigricans.  LYMPH: no submandibular or supraclavicular LAD NEURO: PERRL, 2+ knee DTRs equal and symmetric.  PSYC: alert and oriented, good insight  Labs Results for orders placed or performed during the hospital encounter of 10/14/15 (from the past 24 hour(s))  Glucose, capillary     Status: Abnormal   Collection Time: 10/14/15  2:19 PM  Result Value Ref Range   Glucose-Capillary 404 (H) 65 - 99 mg/dL  Glucose, capillary     Status: Abnormal   Collection Time: 10/14/15  3:18 PM  Result Value Ref Range   Glucose-Capillary 345 (H) 65 - 99 mg/dL  Glucose, capillary     Status: Abnormal   Collection Time: 10/14/15  4:14 PM  Result Value Ref Range   Glucose-Capillary 269 (H) 65 - 99 mg/dL  Glucose, capillary     Status: Abnormal   Collection Time: 10/14/15  5:11 PM  Result Value Ref Range   Glucose-Capillary 212 (H) 65 - 99 mg/dL  Glucose, capillary     Status: Abnormal   Collection Time: 10/14/15  6:14 PM  Result Value Ref Range   Glucose-Capillary 161 (H) 65 - 99 mg/dL  Comprehensive metabolic panel     Status: Abnormal   Collection Time: 10/14/15  6:49 PM  Result Value Ref Range   Sodium 142 135 - 145 mmol/L   Potassium 3.1 (L) 3.5 - 5.1 mmol/L   Chloride 105 101 - 111 mmol/L   CO2 27 22 - 32 mmol/L   Glucose, Bld 150 (H) 65 - 99 mg/dL   BUN 18 6 - 20 mg/dL   Creatinine, Ser 1.56 (H) 0.44 - 1.00 mg/dL   Calcium 9.4 8.9 - 10.3 mg/dL   Total Protein 8.4 (H) 6.5 - 8.1 g/dL   Albumin 4.1 3.5 - 5.0 g/dL   AST 17 15 - 41 U/L   ALT 14 14 - 54 U/L   Alkaline Phosphatase 86 38 - 126 U/L   Total Bilirubin 0.3 0.3 - 1.2 mg/dL   GFR calc non Af Amer 42 (L)  >60 mL/min   GFR calc Af Amer 49 (L) >60 mL/min   Anion gap 10 5 - 15  Glucose, capillary     Status: Abnormal   Collection Time: 10/14/15  7:21 PM  Result Value Ref Range   Glucose-Capillary 142 (H) 65 - 99 mg/dL  Glucose, capillary     Status: Abnormal   Collection Time: 10/14/15  8:26 PM  Result Value Ref Range   Glucose-Capillary 148 (H) 65 - 99 mg/dL  Glucose, capillary     Status: Abnormal   Collection Time: 10/14/15  9:26 PM  Result Value Ref Range   Glucose-Capillary 240 (H) 65 - 99 mg/dL  Glucose, capillary     Status: Abnormal   Collection Time: 10/14/15 10:22 PM  Result Value Ref Range   Glucose-Capillary 228 (H) 65 - 99 mg/dL  Glucose, capillary     Status: Abnormal   Collection Time: 10/14/15 11:23 PM  Result Value Ref Range   Glucose-Capillary 182 (H) 65 - 99 mg/dL  Comprehensive metabolic panel     Status: Abnormal   Collection Time: 10/14/15 11:52 PM  Result Value Ref Range   Sodium 139 135 - 145 mmol/L   Potassium 3.0 (L) 3.5 - 5.1 mmol/L   Chloride 108 101 - 111 mmol/L   CO2 26 22 - 32 mmol/L   Glucose, Bld 159 (H) 65 - 99 mg/dL   BUN 16 6 - 20 mg/dL   Creatinine, Ser 1.65 (H) 0.44 - 1.00 mg/dL   Calcium 9.0 8.9 - 10.3 mg/dL   Total Protein 7.4 6.5 - 8.1 g/dL   Albumin 3.7 3.5 - 5.0 g/dL   AST 19 15 - 41 U/L   ALT 13 (L) 14 - 54 U/L   Alkaline Phosphatase 71 38 - 126 U/L   Total Bilirubin 0.5 0.3 - 1.2 mg/dL   GFR calc non Af Amer 39 (L) >60 mL/min   GFR calc Af Amer 46 (L) >60 mL/min   Anion gap 5 5 - 15  Glucose, capillary     Status: Abnormal   Collection Time: 10/15/15 12:25 AM  Result Value Ref Range   Glucose-Capillary 193 (H) 65 - 99 mg/dL  Glucose, capillary     Status: Abnormal   Collection Time: 10/15/15  1:47 AM  Result Value Ref Range   Glucose-Capillary 106 (H) 65 - 99 mg/dL  Glucose, capillary     Status: Abnormal   Collection Time: 10/15/15  3:01 AM  Result Value Ref Range   Glucose-Capillary 101 (H)  65 - 99 mg/dL  CBC      Status: Abnormal   Collection Time: 10/15/15  3:40 AM  Result Value Ref Range   WBC 16.7 (H) 3.6 - 11.0 K/uL   RBC 5.19 3.80 - 5.20 MIL/uL   Hemoglobin 12.4 12.0 - 16.0 g/dL   HCT 38.0 35.0 - 47.0 %   MCV 73.2 (L) 80.0 - 100.0 fL   MCH 24.0 (L) 26.0 - 34.0 pg   MCHC 32.8 32.0 - 36.0 g/dL   RDW 13.7 11.5 - 14.5 %   Platelets 246 150 - 440 K/uL  Protime-INR     Status: Abnormal   Collection Time: 10/15/15  3:40 AM  Result Value Ref Range   Prothrombin Time 15.1 (H) 11.4 - 15.0 seconds   INR 1.17   APTT     Status: None   Collection Time: 10/15/15  3:40 AM  Result Value Ref Range   aPTT 27 24 - 36 seconds  Lipid panel     Status: Abnormal   Collection Time: 10/15/15  3:40 AM  Result Value Ref Range   Cholesterol 162 0 - 200 mg/dL   Triglycerides 112 <150 mg/dL   HDL 39 (L) >40 mg/dL   Total CHOL/HDL Ratio 4.2 RATIO   VLDL 22 0 - 40 mg/dL   LDL Cholesterol 101 (H) 0 - 99 mg/dL  Magnesium     Status: None   Collection Time: 10/15/15  3:40 AM  Result Value Ref Range   Magnesium 2.1 1.7 - 2.4 mg/dL  Comprehensive metabolic panel     Status: Abnormal   Collection Time: 10/15/15  3:40 AM  Result Value Ref Range   Sodium 140 135 - 145 mmol/L   Potassium 3.5 3.5 - 5.1 mmol/L   Chloride 109 101 - 111 mmol/L   CO2 25 22 - 32 mmol/L   Glucose, Bld 115 (H) 65 - 99 mg/dL   BUN 17 6 - 20 mg/dL   Creatinine, Ser 1.70 (H) 0.44 - 1.00 mg/dL   Calcium 8.9 8.9 - 10.3 mg/dL   Total Protein 7.1 6.5 - 8.1 g/dL   Albumin 3.5 3.5 - 5.0 g/dL   AST 15 15 - 41 U/L   ALT 13 (L) 14 - 54 U/L   Alkaline Phosphatase 68 38 - 126 U/L   Total Bilirubin 0.7 0.3 - 1.2 mg/dL   GFR calc non Af Amer 38 (L) >60 mL/min   GFR calc Af Amer 44 (L) >60 mL/min   Anion gap 6 5 - 15  Glucose, capillary     Status: Abnormal   Collection Time: 10/15/15  5:25 AM  Result Value Ref Range   Glucose-Capillary 171 (H) 65 - 99 mg/dL  Glucose, capillary     Status: Abnormal   Collection Time: 10/15/15  6:30 AM  Result  Value Ref Range   Glucose-Capillary 189 (H) 65 - 99 mg/dL  Glucose, capillary     Status: Abnormal   Collection Time: 10/15/15  7:29 AM  Result Value Ref Range   Glucose-Capillary 209 (H) 65 - 99 mg/dL  Glucose, capillary     Status: Abnormal   Collection Time: 10/15/15  7:34 AM  Result Value Ref Range   Glucose-Capillary 221 (H) 65 - 99 mg/dL  Comprehensive metabolic panel     Status: Abnormal   Collection Time: 10/15/15  8:02 AM  Result Value Ref Range   Sodium 139 135 - 145 mmol/L   Potassium 3.4 (L) 3.5 - 5.1 mmol/L  Chloride 106 101 - 111 mmol/L   CO2 24 22 - 32 mmol/L   Glucose, Bld 221 (H) 65 - 99 mg/dL   BUN 17 6 - 20 mg/dL   Creatinine, Ser 1.88 (H) 0.44 - 1.00 mg/dL   Calcium 8.8 (L) 8.9 - 10.3 mg/dL   Total Protein 7.0 6.5 - 8.1 g/dL   Albumin 3.4 (L) 3.5 - 5.0 g/dL   AST 15 15 - 41 U/L   ALT 13 (L) 14 - 54 U/L   Alkaline Phosphatase 65 38 - 126 U/L   Total Bilirubin 0.4 0.3 - 1.2 mg/dL   GFR calc non Af Amer 34 (L) >60 mL/min   GFR calc Af Amer 39 (L) >60 mL/min   Anion gap 9 5 - 15  Glucose, capillary     Status: Abnormal   Collection Time: 10/15/15  8:28 AM  Result Value Ref Range   Glucose-Capillary 227 (H) 65 - 99 mg/dL  Glucose, capillary     Status: Abnormal   Collection Time: 10/15/15  9:49 AM  Result Value Ref Range   Glucose-Capillary 348 (H) 65 - 99 mg/dL  Glucose, capillary     Status: Abnormal   Collection Time: 10/15/15 11:14 AM  Result Value Ref Range   Glucose-Capillary 225 (H) 65 - 99 mg/dL  Glucose, capillary     Status: Abnormal   Collection Time: 10/15/15 12:00 PM  Result Value Ref Range   Glucose-Capillary 271 (H) 65 - 99 mg/dL  Comprehensive metabolic panel     Status: Abnormal   Collection Time: 10/15/15 12:27 PM  Result Value Ref Range   Sodium 139 135 - 145 mmol/L   Potassium 3.6 3.5 - 5.1 mmol/L   Chloride 107 101 - 111 mmol/L   CO2 24 22 - 32 mmol/L   Glucose, Bld 237 (H) 65 - 99 mg/dL   BUN 17 6 - 20 mg/dL   Creatinine,  Ser 2.00 (H) 0.44 - 1.00 mg/dL   Calcium 9.0 8.9 - 10.3 mg/dL   Total Protein 7.2 6.5 - 8.1 g/dL   Albumin 3.5 3.5 - 5.0 g/dL   AST 21 15 - 41 U/L   ALT 14 14 - 54 U/L   Alkaline Phosphatase 67 38 - 126 U/L   Total Bilirubin 0.6 0.3 - 1.2 mg/dL   GFR calc non Af Amer 31 (L) >60 mL/min   GFR calc Af Amer 36 (L) >60 mL/min   Anion gap 8 5 - 15    Assessment 1. Uncontrolled diabetes, new diagnosis 2. Hyperosmolar hyperglycemic state - resolved 3. Acute kidney injury 4. 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 Obesity 6. Tobacco dependence  Plan 1. Counseled her about diabetes and about blood sugar goals. 2. Counseled on need for self-monitoring of blood sugars. Needs to purchase a glucometer after hospital discharge. Encouraged her to check sugars qACHS.  3. Continue low carb diet. 4. Sugars now reasonably controlled. Will convert to SQ insulin. Will start NPH + Regular, as these will be continued at discharge due to their low cost (she does not have medical insurance. Will give NPH 15 units now then stop IV insulin in 2 hrs. Will then give NPH 25 units bid + regular 20 units tid AC + regular SSI (moderate). 5. Once renal function improves and eGFR consistently >30 then could consider adding metformin.  Will not be available to see patient over upcoming weekend. If she remains hospitalized, then she will be seen on Monday 10/18/15. Will schedule a follow  up with [facility] on 10/25/15.   Time spent with patient was 42 min and >50% of visit counseling/coordinating care.

## 2015-10-15 NOTE — Progress Notes (Signed)
Initial Nutrition Assessment   INTERVENTION:   Education: provided verbal/written education on diabetic diet using Plate Method to pt; discussed sources of carbs, basic meal planning, avoidance of sugar sweetened drinks and eating regularly scheduled meals. After review, pt was unable to correctly identify the carbohydrate containing foods on her meal trays. Fair adherence to diet likely due to pt's inability to demonstrate adequate knowledge of diet post extubation. Pt also appeared very distracted during education today with multiple friends in and out of room. Will review/reinforce diet on follow-up; will be happy to come by sooner if requested by pt to answer any questions.  Meals and Snacks: Cater to patient preferences   NUTRITION DIAGNOSIS:   Food and nutrition related knowledge deficit related to  (new dx of DM) as evidenced by  (consult for diet education).  GOAL:   Patient will meet greater than or equal to 90% of their needs  MONITOR:    (Energy Intake, Knowledge, Anthropometrics, Electrolyte/Renal Profile, Glucose Profile)  REASON FOR ASSESSMENT:   Consult Diet education  ASSESSMENT:    Pt admitted with AMS due to hypertensive encephalopathy, ARF, nonketotic hyperglycemia with newly dx DM  Past Medical History  Diagnosis Date  . Hypertension     Diet Order:  Diet heart healthy/carb modified Room service appropriate?: Yes; Fluid consistency:: Thin   Energy Intake: pt reports good appetite, eating lunch on visit today but not happy with meal tray at lunch. RD called down for new tray  Food and Nutrition related history: pt reports good appetite prior to admission  Electrolyte and Renal Profile:  Recent Labs Lab 10/15/15 0340 10/15/15 0802 10/15/15 1227  BUN 17 17 17   CREATININE 1.70* 1.88* 2.00*  NA 140 139 139  K 3.5 3.4* 3.6  MG 2.1  --   --    Glucose Profile:  Recent Labs  10/15/15 0949 10/15/15 1114 10/15/15 1200  GLUCAP 348* 225* 271*   No  results found for: HGBA1C  Meds: reviewed  Height:   Ht Readings from Last 1 Encounters:  10/14/15 5\' 8"  (1.727 m)    Weight:   Wt Readings from Last 1 Encounters:  10/14/15 222 lb 10.6 oz (101 kg)    Filed Weights   10/14/15 0859 10/14/15 1631  Weight: 278 lb (126.1 kg) 222 lb 10.6 oz (101 kg)    BMI:  Body mass index is 33.86 kg/(m^2).  LOW Care Level  Romelle Starcherate Malaquias Lenker MS, [place]RD, LDN [phone] Pager

## 2015-10-15 NOTE — Progress Notes (Addendum)
[facility] [place] [facility]   PATIENT NAME: Sabrina FallsJennifer Davenport    MR#:  [id number]  DATE OF BIRTH:  Mar 14, 1981  SUBJECTIVE:  CHIEF COMPLAINT:   Chief Complaint  Patient presents with  . Altered Mental Status   Patient presented with altered mental status and noted to be in nonketotic hyperosmolar hyperglycemic state. She was also noted to have hypertensive urgency, and noted to be in acute renal failure. Blood sugars still a bit high and remains on insulin drip. Blood pressure is labile. More awake and alert today.  REVIEW OF SYSTEMS:    Review of Systems  Constitutional: Negative for fever and chills.  HENT: Negative for congestion and tinnitus.   Eyes: Negative for blurred vision and double vision.  Respiratory: Negative for cough, shortness of breath and wheezing.   Cardiovascular: Negative for chest pain, orthopnea and PND.  Gastrointestinal: Negative for nausea, vomiting, abdominal pain and diarrhea.  Genitourinary: Negative for dysuria and hematuria.  Neurological: Negative for dizziness, sensory change and focal weakness.  All other systems reviewed and are negative.   Nutrition: Carb control heart healthy Tolerating Diet: Yes Tolerating PT: Await Eval.    DRUG ALLERGIES:  No Known Allergies  VITALS:  Blood pressure 181/117, pulse 102, temperature 97.8 F (36.6 C), temperature source Oral, resp. rate 20, height 5\' 8"  (1.727 m), weight 101 kg (222 lb 10.6 oz), SpO2 100 %.  PHYSICAL EXAMINATION:   Physical Exam  GENERAL:  34 y.o.-year-old obese patient lying in the bed with no acute distress.  EYES: Pupils equal, round, reactive to light and accommodation. No scleral icterus. Extraocular muscles intact.  HEENT: Head atraumatic, normocephalic. Oropharynx and nasopharynx clear.  NECK:  Supple, no jugular venous distention. No thyroid enlargement, no tenderness.  LUNGS: Normal breath sounds bilaterally, no wheezing, rales, rhonchi. No  use of accessory muscles of respiration.  CARDIOVASCULAR: S1, S2 normal. No murmurs, rubs, or gallops.  ABDOMEN: Soft, nontender, nondistended. Bowel sounds present. No organomegaly or mass.  EXTREMITIES: No cyanosis, clubbing or edema b/l.    NEUROLOGIC: Cranial nerves II through XII are intact. No focal Motor or sensory deficits b/l.   PSYCHIATRIC: The patient is alert and oriented x 3. Good affect.  SKIN: No obvious rash, lesion, or ulcer.    LABORATORY PANEL:   CBC  Recent Labs Lab 10/15/15 0340  WBC 16.7*  HGB 12.4  HCT 38.0  PLT 246   ------------------------------------------------------------------------------------------------------------------  Chemistries   Recent Labs Lab 10/15/15 0340  10/15/15 1227  NA 140  < > 139  K 3.5  < > 3.6  CL 109  < > 107  CO2 25  < > 24  GLUCOSE 115*  < > 237*  BUN 17  < > 17  CREATININE 1.70*  < > 2.00*  CALCIUM 8.9  < > 9.0  MG 2.1  --   --   AST 15  < > 21  ALT 13*  < > 14  ALKPHOS 68  < > 67  BILITOT 0.7  < > 0.6  < > = values in this interval not displayed. ------------------------------------------------------------------------------------------------------------------  Cardiac Enzymes  Recent Labs Lab 10/14/15 0905  TROPONINI 0.04*   ------------------------------------------------------------------------------------------------------------------  RADIOLOGY:  Ct Head Wo Contrast  10/14/2015  CLINICAL DATA:  confusion/altered mental status EXAM: CT HEAD WITHOUT CONTRAST TECHNIQUE: Contiguous axial images were obtained from the base of the skull through the vertex without intravenous contrast. COMPARISON:  None. FINDINGS: The ventricles are normal in size  and configuration. Cranial mass, hemorrhage, extra-axial fluid collection, or midline shift. There is a small focus of decreased attenuation in the anterior limb of the right external capsule consistent with a small recent infarct. Elsewhere gray-white  compartments are normal. Bony calvarium appears intact. The mastoid air cells are clear. 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 Gray-white compartments elsewhere appear normal. No hemorrhage or mass effect. Electronically Signed   By: Bretta Bang III M.D.   On: 10/14/2015 11:07     ASSESSMENT AND PLAN:   34 year old female with past medical history of hypertension who presented to the hospital due to altered mental status and noted to have acute renal failure, nonketotic hyperglycemic state and also on hypertensive urgency.  #1 altered mental status-this was likely multifactorial in nature.  Likely secondary to hypertensive encephalopathy, acute renal failure, nonketotic hyperglycemia. -Patient's blood sugars have improved,  her renal function is stable. She was on Precedex drip but not has been weaned off of it and mental status is much improved. CT head showed a possible subacute infarct. Seen by neurology who did not think this is an acute stroke and do not want to do any further imaging at this time. - cont. ASA, Statin.   #2 nonketotic hyperglycemic state-blood sugars have significantly improved and we'll continue IV fluids, insulin drip. -Appreciate endocrinology input eventually patient will be transitioned off insulin drip to subcutaneous insulin. Patient with diabetic lifestyle input.  #3 acute renal failure-likely ATN due to hypoglycemia and dehydration. -Continue IV fluids, follow BUN and creatinine and urine output. Appreciate nephrology input they think the patient could have possible underlying CKD.   #4 hypertensive urgency-blood pressures are still labile. Patient was not taking any blood pressure meds at home. -Now has been started on Norvasc, Coreg. Continue when necessary hydralazine.  #5 leukocytosis-likely stress mediated follow white cell count.   All the records are reviewed and case discussed with Care  Management/Social Workerr. Management plans discussed with the patient, family and they are in agreement.  CODE STATUS: Full  DVT Prophylaxis: Heparin Subcutaneous  TOTAL TIME TAKING CARE OF THIS PATIENT: 30 minutes.   POSSIBLE D/C IN 1-2 DAYS, DEPENDING ON CLINICAL CONDITION.   Houston Siren M.D on 10/15/2015 at 1:29 PM  Between 7am to 6pm - Pager - [phone]  After 6pm go to [email or web address] - password [email or web address] [facility]  [place]  Office  [phone]  CC: Primary care physician; No primary care provider on file.

## 2015-10-15 NOTE — Progress Notes (Signed)
PT Attempt Note  Patient Details Name: Sabrina FurbishJennifer R Davenport MRN: [id number] DOB: 13-Jan-1981   Cancelled Treatment:    Reason Eval/Treat Not Completed: Medical issues which prohibited therapy. Chart reviewed and RN consulted. Attempted to see patient x 2 this morning. First attempt BP was 155/114 and second attempt BP was 172/130. Pt is contraindicated for PT evaluation due to severely elevated diastolic BP. Will attempt PT evaluation at later time/date as patient is appropriate.  Sharalyn InkJason D Huprich PT, DPT   Huprich,Jason 10/15/2015, 10:03 AM

## 2015-10-15 NOTE — Care Management Note (Signed)
Case Management Note  Patient Details  Name: Sabrina FurbishJennifer Davenport Davenport MRN: [id number] Date of Birth: [birth year]  Subjective/Objective:                   Patient presents from home.  EMS was called by patient's father due to altered mental status.  Patient became combative and had to be restrained during transport to ED.  Drug Screen negative.  Patient's affect seem flat.  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  Knew that she had high blood pressure but has not been on nay medications and has not seen a doctor since she got out of prison two years ago.  Admitted with acute/subacute CVA, hyperosmolar hyperglycemia and uncontrolled hypertension.  Has been assessed by diabetic coordinator, neurology and nephrology.  Hb A1C is mentioned as pending in progress note but do not see an order.  Spoke with primary nurse Adam and will obtain this order.  Discussed discharge concerns- patient without pcp, no insurance, knowledge deficit regarding disease process  and management of  diabetes.  It is verbally reported that patient will require insulin injections upon discharge.  She has not been able to demonstrate insulin preparation or administration.  Discussed that if may better meet medical needs to obtain PCP.  Discussed sliding scale clinic- [facility] .   Patient's cousin is present during discussion and says that she  only has to pay 25 dollars a visit.    Discussed if could meet financial obligation- it may be a better choice to get established with a PCP.   Discussed [facility] and the need to complete all aspects of the application prior to obtaining the first appointment.   Discussed [facility] and that could not receive assistance from [facility] and the [facility].  Patient  does not  appear very engaged during conversation about her care planning.   Provided patient with a glucose monitor.  Patient should not discharge  without being able to administer her insulin.  She will need home health nursing and social work.  Patient denies problems with transportation.  She does not provide any information about her finances.  PT Consult is pending.  Confirmed contact information and address. She [address]  Entered the changes in Epic  Action/Plan:  Contacted [facility] for charity care assessment.  Anticipated disciplines- SN PT (if recommended - patient did have cva) and social work.  Should be able to demonstrate insulin preapration, injection and glucose testing  Expected Discharge Date:                  Expected Discharge Plan:    In-House Referral:     Discharge planning Services   Post Acute Care Choice:    Choice offered to:     DME Arranged:   [facility]ided patient [facility] glucom[facility] Agency:     HH Arranged:    HH Agency:     Status of Service:  In process, will continue to follow  Medicare Important Message Given:    Date Medicare IM Given:    Medicare IM give by:    Date Additional Medicare IM Given:    Additional Medicare Important Message give by:     If discussed at Long Length of Stay Meetings, dates discussed:    Additional Comments:   Eber HongGreene, Sabrina Kinnear R, RN 10/15/2015, 4:00 PM

## 2015-10-15 NOTE — Care Management Note (Signed)
Case Management Note  Patient Details  Name: Sabrina Davenport MRN: [id number] Date of Birth: Oct 19, 1981  Subjective/Objective:    Met with patient and her mother to discuss discharge planning. Application given for [facility] and [facility]. Emil sent to Anell Barr with [facility] for immediate follow up. Mother states they will be able to pay for any discharge medications needed without difficulty. Patient has a new  glucometer at bedside. Enrolled patient in [facility] program and gave list of pharmacies > Start effective date 10/16/2015 through 10/22/2015  Action/Plan: Patient to follow up with above clinics.    Expected Discharge Date:                  Expected Discharge Plan:  Home/Self Care  In-House Referral:     Discharge planning Services  CM Consult, Medication Assistance, [facility]  Post Acute Care Choice:    Choice offered to:     DME Arranged:    DME Agency:     HH Arranged:    HH Agency:     Status of Service:  In process, will continue to follow  Medicare Important Message Given:    Date Medicare IM Given:    Medicare IM give by:    Date Additional Medicare IM Given:    Additional Medicare Important Message give by:     If discussed at [place] of Stay Meetings, dates discussed:    Additional Comments:  Jolly Mango, RN 10/15/2015, 2:02 PM

## 2015-10-15 NOTE — Progress Notes (Signed)
eLink Physician-Brief Progress Note Patient Name: Sabrina FurbishJennifer R Porada DOB: [birth year] MRN: [id number]   Date of Service  10/15/2015  HPI/Events of Note  Continued hypertension despite scheduled meds and prn of labetalol 10 mg q2 hours and hydralazine 10 mg q6 hours.  Current BP of 184/111 with HR of 100.  When patient received the hydralazine BP did decrease.  eICU Interventions  Plan: Change PRN hydralazine to q 2 hours PRN Continue [facility]     Intervention Category Intermediate Interventions: Hypertension - evaluation and management  DETERDING,ELIZABETH 10/15/2015, 10:40 PM

## 2015-10-16 LAB — CBC
HCT: 39.3 % (ref 35.0–47.0)
Hemoglobin: 12.9 g/dL (ref 12.0–16.0)
MCH: 24.1 pg — AB (ref 26.0–34.0)
MCHC: 32.8 g/dL (ref 32.0–36.0)
MCV: 73.4 fL — ABNORMAL LOW (ref 80.0–100.0)
PLATELETS: 255 10*3/uL (ref 150–440)
RBC: 5.36 MIL/uL — ABNORMAL HIGH (ref 3.80–5.20)
RDW: 13.6 % (ref 11.5–14.5)
WBC: 12.5 10*3/uL — ABNORMAL HIGH (ref 3.6–11.0)

## 2015-10-16 LAB — GLUCOSE, CAPILLARY
GLUCOSE-CAPILLARY: 193 mg/dL — AB (ref 65–99)
GLUCOSE-CAPILLARY: 112 mg/dL — AB (ref 65–99)
Glucose-Capillary: 169 mg/dL — ABNORMAL HIGH (ref 65–99)
Glucose-Capillary: 113 mg/dL — ABNORMAL HIGH (ref 65–99)
Glucose-Capillary: 94 mg/dL (ref 65–99)

## 2015-10-16 MED ORDER — CLONIDINE HCL 0.1 MG PO TABS
0.1000 mg | ORAL_TABLET | Freq: Two times a day (BID) | ORAL | Status: DC
  Administered 2015-10-16 – 2015-10-17 (×3): 0.1 mg via ORAL
  Filled 2015-10-16 (×3): qty 1

## 2015-10-16 MED ORDER — CARVEDILOL 12.5 MG PO TABS
12.5000 mg | ORAL_TABLET | Freq: Two times a day (BID) | ORAL | Status: DC
  Administered 2015-10-16 – 2015-10-19 (×6): 12.5 mg via ORAL
  Filled 2015-10-16 (×6): qty 1

## 2015-10-16 MED ORDER — AMLODIPINE BESYLATE 10 MG PO TABS
10.0000 mg | ORAL_TABLET | Freq: Every day | ORAL | Status: DC
  Administered 2015-10-16 – 2015-10-19 (×4): 10 mg via ORAL
  Filled 2015-10-16 (×4): qty 1

## 2015-10-16 MED ORDER — CARVEDILOL 6.25 MG PO TABS
6.2500 mg | ORAL_TABLET | Freq: Once | ORAL | Status: AC
  Administered 2015-10-16: 6.25 mg via ORAL
  Filled 2015-10-16: qty 1

## 2015-10-16 NOTE — Progress Notes (Signed)
[facility]  ROUNDING NOTE   Subjective:  Awake and alert this morning.  Off IV fluids.  UOP not recorded.   Objective:  Vital signs in last 24 hours:  Temp:  [97.7 F (36.5 C)-98.3 F (36.8 C)] 97.8 F (36.6 C) (10/29 0600) Pulse Rate:  [92-113] 92 (10/29 0600) Resp:  [12-30] 21 (10/29 0600) BP: (149-190)/(80-135) 159/107 mmHg (10/29 0600) SpO2:  [96 %-100 %] 99 % (10/29 0600) Weight:  [101 kg (222 lb 10.6 oz)] 101 kg (222 lb 10.6 oz) (10/29 0500)  Weight change: -25.1 kg (-55 lb 5.4 oz) Filed Weights   10/14/15 0859 10/14/15 1631 10/16/15 0500  Weight: 126.1 kg (278 lb) 101 kg (222 lb 10.6 oz) 101 kg (222 lb 10.6 oz)    Intake/Output: I/O last 3 completed shifts: In: 1473.1 [I.V.:1073.1; IV Piggyback:400] Out: -    Intake/Output this shift:     Physical Exam: General: NAD, awake, alert.  Head: Normocephalic, atraumatic. Moist oral mucosal membranes  Eyes: Anicteric  Neck: Supple, trachea midline  Lungs:  Clear to auscultation normal effort  Heart: tachycardia  Abdomen:  Soft, nontender, BS present  Extremities: no peripheral edema.  Neurologic: Nonfocal, moving all four extremities  Skin: No lesions       Basic Metabolic Panel:  Recent Labs Lab 10/15/15 0340 10/15/15 0802 10/15/15 1227 10/15/15 1644 10/15/15 2031  NA 140 139 139 135 140  K 3.5 3.4* 3.6 3.2* 3.5  CL 109 106 107 105 108  CO2 25 24 24 24 24   GLUCOSE 115* 221* 237* 254* 100*  BUN 17 17 17 18 18   CREATININE 1.70* 1.88* 2.00* 1.93* 1.78*  CALCIUM 8.9 8.8* 9.0 8.6* 9.2  MG 2.1  --   --   --   --     Liver Function Tests:  Recent Labs Lab 10/15/15 0340 10/15/15 0802 10/15/15 1227 10/15/15 1644 10/15/15 2031  AST 15 15 21 23 21   ALT 13* 13* 14 14 15   ALKPHOS 68 65 67 67 68  BILITOT 0.7 0.4 0.6 0.6 0.7  PROT 7.1 7.0 7.2 6.9 7.3  ALBUMIN 3.5 3.4* 3.5 3.5 3.6   No results for input(s): LIPASE, AMYLASE in the last 168 hours. No results for input(s): AMMONIA in  the last 168 hours.  CBC:  Recent Labs Lab 10/14/15 0905 10/15/15 0340 10/16/15 0433  WBC 15.2* 16.7* 12.5*  NEUTROABS 12.8*  --   --   HGB 15.0 12.4 12.9  HCT 47.3* 38.0 39.3  MCV 74.9* 73.2* 73.4*  PLT 305 246 255    Cardiac Enzymes:  Recent Labs Lab 10/14/15 0905  TROPONINI 0.04*    BNP: Invalid input(s): POCBNP  CBG:  Recent Labs Lab 10/15/15 1447 10/15/15 1725 10/15/15 2128 10/16/15 0113 10/16/15 0710  GLUCAP 312* 231* 114* 193* 169*    Microbiology: Results for orders placed or performed during the hospital encounter of 10/14/15  MRSA PCR Screening     Status: None   Collection Time: 10/15/15  5:50 PM  Result Value Ref Range Status   MRSA by PCR NEGATIVE NEGATIVE Final    Comment:        The GeneXpert MRSA Assay (FDA approved for NASAL specimens only), is one component of a comprehensive MRSA colonization surveillance program. It is not intended to diagnose MRSA infection nor to guide or monitor treatment for MRSA infections.     Coagulation Studies:  Recent Labs  10/15/15 0340  LABPROT 15.1*  INR 1.17    Urinalysis:  Recent Labs  10/14/15 0906  COLORURINE STRAW*  LABSPEC 1.021  PHURINE 6.0  GLUCOSEU >500*  HGBUR 1+*  BILIRUBINUR NEGATIVE  KETONESUR NEGATIVE  PROTEINUR 100*  NITRITE NEGATIVE  LEUKOCYTESUR NEGATIVE      Imaging: Ct Head Wo Contrast  10/14/2015  CLINICAL DATA:  confusion/altered mental status EXAM: CT HEAD WITHOUT CONTRAST TECHNIQUE: Contiguous axial images were obtained from the base of the skull through the vertex without intravenous contrast. COMPARISON:  None. FINDINGS: The ventricles are normal in size and configuration. Cranial mass, hemorrhage, extra-axial fluid collection, or midline shift. There is a small focus of decreased attenuation in the anterior limb of the right external capsule consistent with a small recent infarct. Elsewhere gray-white compartments are normal. Bony calvarium appears  intact. The mastoid air cells are clear. 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 Gray-white compartments elsewhere appear normal. No hemorrhage or mass effect. Electronically Signed   By: Bretta Bang III M.D.   On: 10/14/2015 11:07   US Renal  10/15/2015  CLINICAL DATA:  Acute renal failure EXAM: RENAL / URINARY TRACT ULTRASOUND COMPLETE COMPARISON:  None. FINDINGS: Right Kidney: Length: 12.2 cm. Echogenicity within normal limits. No hydronephrosis. A midpole cyst measures 1.2 x 1.1 cm Left Kidney: Length: 30 in cm in length. Echogenicity within normal limits. No mass or hydronephrosis visualized. Bladder: Appears normal for degree of bladder distention. IMPRESSION: No hydronephrosis. No nephrolithiasis. Cyst in midpole of the right kidney measures 1.2 cm. Electronically Signed   By: Natasha Mead M.D.   On: 10/15/2015 16:06     Medications:   . dexmedetomidine Stopped (10/15/15 1009)  . dextrose 5 % and 0.45 % NaCl with KCl 40 mEq/L 100 mL/hr at 10/14/15 1927  . insulin (NOVOLIN-R) infusion 7.6 Units/hr (10/14/15 1717)  . insulin (NOVOLIN-R) infusion Stopped (10/15/15 1458)  . niCARDipine     . amLODipine  5 mg Oral Daily  . antiseptic oral rinse  7 mL Mouth Rinse q12n4p  . aspirin  81 mg Oral Daily  . atorvastatin  40 mg Oral q1800  . carvedilol  6.25 mg Oral BID WC  . chlorhexidine  15 mL Mouth Rinse BID  . docusate sodium  100 mg Oral BID  . heparin  5,000 Units Subcutaneous 3 times per day  . insulin aspart  20 Units Subcutaneous TID WC  . insulin aspart  3-15 Units Subcutaneous TID AC  . insulin aspart  3-15 Units Subcutaneous QHS  . insulin detemir  25 Units Subcutaneous BID AC & HS  . sodium chloride  3 mL Intravenous Q12H   acetaminophen **OR** acetaminophen, alum & mag hydroxide-simeth, bisacodyl, hydrALAZINE, HYDROcodone-acetaminophen, labetalol, ondansetron **OR** ondansetron (ZOFRAN) IV, traZODone  Assessment/ Plan:   34 y.o. black female  with  hypertension, was admitted to [facility] on 10/14/2015 with newly diagnosed diabetes mellitus type 2, altered mental status, acute renal failure, hypokalemia.  1. Acute renal failure due to ATN with proteinuria: unclear if underlying chronic kidney disease from hypertensive nephrosclerosis and diabetic nephropathy. No clear history of NSAID use.  - Creatinine stable since admission. Volume status acceptable. Urine output not recorded.  - Continue to monitor renal function. Renally dose medications.  - Ultrasound reviewed with patient - No indication for dialysis.   2. Hypertension: blood pressure control not goal. Previously on nicardipine gtt.  - Currently on amlodipine and carvedilol. Will increase dose of both medications today. Both these medications may take several days to get full  effect.  - Will need to add an ACE-I/ARB when more stable.   3. 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  - Agree with glucose control.    LOS: 2 [place] 10/29/20168:10 AM

## 2015-10-16 NOTE — Progress Notes (Signed)
Patient ID: Sabrina FurbishJennifer R Gair, female   DOB: 08/22/1981, 34 y.o.   MRN: [id number] [facility]  PCP: No primary care provider on file.  HPI/Subjective: Patient having headache. Doesn't offer any other complaints.  Objective: Filed Vitals:   10/16/15 1300  BP: 173/146  Pulse: 99  Temp:   Resp: 17    Filed Weights   10/14/15 0859 10/14/15 1631 10/16/15 0500  Weight: 126.1 kg (278 lb) 101 kg (222 lb 10.6 oz) 101 kg (222 lb 10.6 oz)    ROS: Review of Systems  Constitutional: Negative for fever and chills.  Eyes: Negative for blurred vision.  Respiratory: Negative for cough and shortness of breath.   Cardiovascular: Negative for chest pain.  Gastrointestinal: Negative for nausea, vomiting, abdominal pain, diarrhea and constipation.  Genitourinary: Negative for dysuria.  Musculoskeletal: Negative for joint pain.  Neurological: Positive for headaches. Negative for dizziness.   Exam: Physical Exam  Constitutional: She is oriented to person, place, and time.  HENT:  Nose: No mucosal edema.  Mouth/Throat: No oropharyngeal exudate or posterior oropharyngeal edema.  Eyes: Conjunctivae, EOM and lids are normal. Pupils are equal, round, and reactive to light.  Neck: No JVD present. Carotid bruit is not present. No edema present. No thyroid mass and no thyromegaly present.  Cardiovascular: S1 normal and S2 normal.  Exam reveals no gallop.   No murmur heard. Pulses:      Dorsalis pedis pulses are 2+ on the right side, and 2+ on the left side.  Respiratory: No respiratory distress. She has no wheezes. She has no rhonchi. She has no rales.  GI: Soft. Bowel sounds are normal. There is no tenderness.  Musculoskeletal:       Right ankle: She exhibits swelling.       Left ankle: She exhibits swelling.  Lymphadenopathy:    She has no cervical adenopathy.  Neurological: She is alert and oriented to person, place, and time. No cranial nerve deficit.  Skin: Skin  is warm. No rash noted. Nails show no clubbing.  Psychiatric: She has a normal mood and affect.    Data Reviewed: Basic Metabolic Panel:  Recent Labs Lab 10/15/15 0340 10/15/15 0802 10/15/15 1227 10/15/15 1644 10/15/15 2031  NA 140 139 139 135 140  K 3.5 3.4* 3.6 3.2* 3.5  CL 109 106 107 105 108  CO2 25 24 24 24 24   GLUCOSE 115* 221* 237* 254* 100*  BUN 17 17 17 18 18   CREATININE 1.70* 1.88* 2.00* 1.93* 1.78*  CALCIUM 8.9 8.8* 9.0 8.6* 9.2  MG 2.1  --   --   --   --    Liver Function Tests:  Recent Labs Lab 10/15/15 0340 10/15/15 0802 10/15/15 1227 10/15/15 1644 10/15/15 2031  AST 15 15 21 23 21   ALT 13* 13* 14 14 15   ALKPHOS 68 65 67 67 68  BILITOT 0.7 0.4 0.6 0.6 0.7  PROT 7.1 7.0 7.2 6.9 7.3  ALBUMIN 3.5 3.4* 3.5 3.5 3.6   CBC:  Recent Labs Lab 10/14/15 0905 10/15/15 0340 10/16/15 0433  WBC 15.2* 16.7* 12.5*  NEUTROABS 12.8*  --   --   HGB 15.0 12.4 12.9  HCT 47.3* 38.0 39.3  MCV 74.9* 73.2* 73.4*  PLT 305 246 255   Cardiac Enzymes:  Recent Labs Lab 10/14/15 0905  TROPONINI 0.04*    CBG:  Recent Labs Lab 10/15/15 1725 10/15/15 2128 10/16/15 0113 10/16/15 0710 10/16/15 1130  GLUCAP 231* 114* 193* 169* 113*  Recent Results (from the past 240 hour(s))  MRSA PCR Screening     Status: None   Collection Time: 10/15/15  5:50 PM  Result Value Ref Range Status   MRSA by PCR NEGATIVE NEGATIVE Final    Comment:        The GeneXpert MRSA Assay (FDA approved for NASAL specimens only), is one component of a comprehensive MRSA colonization surveillance program. It is not intended to diagnose MRSA infection nor to guide or monitor treatment for MRSA infections.      Studies: US Renal  10/15/2015  CLINICAL DATA:  Acute renal failure EXAM: RENAL / URINARY TRACT ULTRASOUND COMPLETE COMPARISON:  None. FINDINGS: Right Kidney: Length: 12.2 cm. Echogenicity within normal limits. No hydronephrosis. A midpole cyst measures 1.2 x 1.1 cm Left  Kidney: Length: 30 in cm in length. Echogenicity within normal limits. No mass or hydronephrosis visualized. Bladder: Appears normal for degree of bladder distention. IMPRESSION: No hydronephrosis. No nephrolithiasis. Cyst in midpole of the right kidney measures 1.2 cm. Electronically Signed   By: Natasha Mead M.D.   On: 10/15/2015 16:06    Scheduled Meds: . amLODipine  10 mg Oral Daily  . antiseptic oral rinse  7 mL Mouth Rinse q12n4p  . aspirin  81 mg Oral Daily  . atorvastatin  40 mg Oral q1800  . carvedilol  12.5 mg Oral BID WC  . chlorhexidine  15 mL Mouth Rinse BID  . cloNIDine  0.1 mg Oral BID  . docusate sodium  100 mg Oral BID  . heparin  5,000 Units Subcutaneous 3 times per day  . insulin aspart  20 Units Subcutaneous TID WC  . insulin aspart  3-15 Units Subcutaneous TID AC  . insulin aspart  3-15 Units Subcutaneous QHS  . insulin detemir  25 Units Subcutaneous BID AC & HS  . sodium chloride  3 mL Intravenous Q12H    Assessment/Plan:  1. Accelerated hypertension. Norvasc and Coreg increased. I will add low-dose clonidine. 2. Acute encephalopathy- improved. 3. Hyperglycemic hyperosmolar nonketotic coma. Newly diagnosed diabetes mellitus. On detemir insulin and aspart insulin. Hemoglobin A1c elevated at 10.2. Sugars trending better at this point. 4. Likely chronic kidney disease stage III. We'll need outpatient follow-up with nephrologist  Code Status:     Code Status Orders        Start     Ordered   10/14/15 1543  Full code   Continuous     10/14/15 1542     Family Communication: Parents at the bedside Disposition Plan: Home once blood pressure improved  Consultants:  Nephrology  Endocrinology  Time spent: 25 minutes  Alford Highland  [facility]

## 2015-10-16 NOTE — Evaluation (Signed)
Physical Therapy Evaluation Patient Details Name: Sabrina Davenport MRN: [id number] DOB: 1981-12-01 Today's Date: 10/16/2015   History of Present Illness  Sabrina Davenport is a 34 y.o. female with a known history of hypertension brought to the ER by EMS with confusion. Patient was found by her father laying on the floor not acting right. When EMS arrived patient was confused and combative, brought in prone and restrained. Patient was noted to have high blood glucose noted by EMS. Patient was also noted to have blood in and around her mouth, concerned she may have had a seizure. Patient reportedly does not have a seizure history. Attempted evaluation yesterday but diastolic BP is to high for evaluation. At time of evaluation on this date BP is more appropriately controlled although still elevated. Pt endorses one fall in the last 12 months.   Clinical Impression  Pt is independent with all mobility and ambulation without assistive device. Strength is functional and symmetrical and no deficits in balance identified. Single leg balance at least 15 seconds. No evidence for instability with ambulation and turns. No PT needs identified. Pt encouraged ambulation with admitted. Order will be completed. Please enter new order if status changes or needs arise.     Follow Up Recommendations No PT follow up    Equipment Recommendations  None recommended by PT    Recommendations for Other Services       Precautions / Restrictions Precautions Precautions: Fall Restrictions Weight Bearing Restrictions: No      Mobility  Bed Mobility Overal bed mobility: Independent             General bed mobility comments: good speed and sequencing  Transfers Overall transfer level: Independent Equipment used: None             General transfer comment: Good speed, sequencing, and stability noted  Ambulation/Gait Ambulation/Gait assistance: Independent Ambulation Distance (Feet): 45  Feet Assistive device: None Gait Pattern/deviations: WFL(Within Functional Limits) Gait velocity: Normal Gait velocity interpretation: at or above normal speed for age/gender General Gait Details: Good stability noted with ambulation and turns. No DOE and no LOB. Good speed. WNL for age  Stairs            Wheelchair Mobility    Modified Rankin (Stroke Patients Only)       Balance Overall balance assessment: Independent   Sitting balance-Leahy Scale: Normal       Standing balance-Leahy Scale: Normal   Single Leg Stance - Right Leg: 15         Rhomberg - Eyes Closed: 30   High Level Balance Comments: No balance deficits identified             Pertinent Vitals/Pain Pain Assessment: No/denies pain    Home Living Family/patient expects to be discharged to:: Private residence Living Arrangements: Parent Available Help at Discharge: Family Type of Home: House Home Access: Stairs to enter Entrance Stairs-Rails: Can reach both Entrance Stairs-Number of Steps: 7 Home Layout: One level Home Equipment: Cane - single point (no walker, BSC)      Prior Function Level of Independence: Independent               Hand Dominance        Extremity/Trunk Assessment   Upper Extremity Assessment: Overall WFL for tasks assessed (At least 4+/5 throughout)           Lower Extremity Assessment: Overall WFL for tasks assessed (At least 4+/5 throughout)  Communication   Communication: No difficulties  Cognition Arousal/Alertness: Awake/alert Behavior During Therapy: WFL for tasks assessed/performed Overall Cognitive Status: Within Functional Limits for tasks assessed                      General Comments      Exercises        Assessment/Plan    PT Assessment Patent does not need any further PT services  PT Diagnosis     PT Problem List    PT Treatment Interventions     PT Goals (Current goals can be found in the Care Plan  section) Acute Rehab PT Goals Patient Stated Goal: "I want to get back home" PT Goal Formulation: With patient Time For Goal Achievement: 10/30/15 Potential to Achieve Goals: Good    Frequency     Barriers to discharge        Co-evaluation               End of Session Equipment Utilized During Treatment: Gait belt Activity Tolerance: Patient tolerated treatment well Patient left: in bed;with call bell/phone within reach Nurse Communication: Mobility status         Time: 1020-1035 PT Time Calculation (min) (ACUTE ONLY): 15 min   Charges:   PT Evaluation $Initial PT Evaluation Tier I: 1 Procedure     PT G Codes:       Sharalyn InkJason D Huprich PT, DPT   Huprich,Jason 10/16/2015, 12:38 PM

## 2015-10-16 NOTE — Progress Notes (Signed)
Paged prime doc concerning pt possibly moving out to floor.  Per Dr. Anne HahnWillis ok to transfer pt to any with tele.  Pt is now resting comfortably with no complaints of pain.

## 2015-10-17 LAB — BASIC METABOLIC PANEL
Anion gap: 7 (ref 5–15)
BUN: 19 mg/dL (ref 6–20)
CHLORIDE: 103 mmol/L (ref 101–111)
CO2: 25 mmol/L (ref 22–32)
Calcium: 9.1 mg/dL (ref 8.9–10.3)
Creatinine, Ser: 1.63 mg/dL — ABNORMAL HIGH (ref 0.44–1.00)
GFR calc non Af Amer: 40 mL/min — ABNORMAL LOW (ref >60)
GFR, EST AFRICAN AMERICAN: 46 mL/min — AB (ref >60)
Glucose, Bld: 127 mg/dL — ABNORMAL HIGH (ref 65–99)
Potassium: 3.6 mmol/L (ref 3.5–5.1)
SODIUM: 135 mmol/L (ref 135–145)

## 2015-10-17 LAB — GLUCOSE, CAPILLARY
GLUCOSE-CAPILLARY: 138 mg/dL — AB (ref 65–99)
GLUCOSE-CAPILLARY: 115 mg/dL — AB (ref 65–99)
GLUCOSE-CAPILLARY: 84 mg/dL (ref 65–99)
GLUCOSE-CAPILLARY: 96 mg/dL (ref 65–99)
Glucose-Capillary: 119 mg/dL — ABNORMAL HIGH (ref 65–99)

## 2015-10-17 MED ORDER — CLONIDINE HCL 0.1 MG PO TABS
0.2000 mg | ORAL_TABLET | Freq: Once | ORAL | Status: AC
  Administered 2015-10-17: 0.2 mg via ORAL
  Filled 2015-10-17: qty 2

## 2015-10-17 MED ORDER — INSULIN NPH (HUMAN) (ISOPHANE) 100 UNIT/ML ~~LOC~~ SUSP
18.0000 [IU] | Freq: Two times a day (BID) | SUBCUTANEOUS | Status: DC
  Administered 2015-10-17 – 2015-10-18 (×2): 18 [IU] via SUBCUTANEOUS
  Filled 2015-10-17 (×5): qty 10

## 2015-10-17 MED ORDER — INSULIN NPH (HUMAN) (ISOPHANE) 100 UNIT/ML ~~LOC~~ SUSP
20.0000 [IU] | Freq: Two times a day (BID) | SUBCUTANEOUS | Status: DC
  Filled 2015-10-17: qty 10

## 2015-10-17 MED ORDER — INSULIN DETEMIR 100 UNIT/ML ~~LOC~~ SOLN
20.0000 [IU] | Freq: Two times a day (BID) | SUBCUTANEOUS | Status: DC
  Administered 2015-10-17: 20 [IU] via SUBCUTANEOUS
  Filled 2015-10-17 (×2): qty 0.2

## 2015-10-17 MED ORDER — INSULIN ASPART 100 UNIT/ML ~~LOC~~ SOLN
10.0000 [IU] | Freq: Three times a day (TID) | SUBCUTANEOUS | Status: DC
  Administered 2015-10-17 – 2015-10-19 (×6): 10 [IU] via SUBCUTANEOUS
  Filled 2015-10-17 (×6): qty 10

## 2015-10-17 MED ORDER — CLONIDINE HCL 0.1 MG PO TABS
0.2000 mg | ORAL_TABLET | Freq: Two times a day (BID) | ORAL | Status: DC
  Administered 2015-10-17 – 2015-10-19 (×4): 0.2 mg via ORAL
  Filled 2015-10-17 (×4): qty 2

## 2015-10-17 NOTE — Progress Notes (Addendum)
Patient ID: Sabrina FurbishJennifer R Deshazer, female   DOB: 08-07-1981, 34 y.o.   MRN: [id number]  [facility] PRO[facility]ESS [facility]  PCP: No primary care provider on file.  HPI/Subjective: Patient with 10 minutes of funny feeling on the left side of her face and left arm. She feels her strength is okay. Blood pressure just rechecked and it's very elevated.  Objective: Filed Vitals:   10/17/15 1400  BP: 180/102  Pulse:   Temp:   Resp:     Filed Weights   10/14/15 1631 10/16/15 0500 10/17/15 0500  Weight: 101 kg (222 lb 10.6 oz) 101 kg (222 lb 10.6 oz) 101.8 kg (224 lb 6.9 oz)    ROS: Review of Systems  Constitutional: Negative for fever and chills.  Eyes: Negative for blurred vision.  Respiratory: Negative for cough and shortness of breath.   Cardiovascular: Negative for chest pain.  Gastrointestinal: Negative for nausea, vomiting, abdominal pain, diarrhea and constipation.  Genitourinary: Negative for dysuria.  Musculoskeletal: Negative for joint pain.  Neurological: Positive for dizziness, tingling, sensory change and headaches. Negative for focal weakness.   Exam: Physical Exam  Constitutional: She is oriented to person, place, and time.  HENT:  Nose: No mucosal edema.  Mouth/Throat: No oropharyngeal exudate or posterior oropharyngeal edema.  Eyes: Conjunctivae, EOM and lids are normal. Pupils are equal, round, and reactive to light.  Neck: No JVD present. Carotid bruit is not present. No edema present. No thyroid mass and no thyromegaly present.  Cardiovascular: S1 normal and S2 normal.  Exam reveals no gallop.   No murmur heard. Pulses:      Dorsalis pedis pulses are 2+ on the right side, and 2+ on the left side.  Respiratory: No respiratory distress. She has no wheezes. She has no rhonchi. She has no rales.  GI: Soft. Bowel sounds are normal. There is no tenderness.  Musculoskeletal:       Right ankle: She exhibits swelling.       Left ankle: She exhibits swelling.   Lymphadenopathy:    She has no cervical adenopathy.  Neurological: She is alert and oriented to person, place, and time. No cranial nerve deficit.  Skin: Skin is warm. No rash noted. Nails show no clubbing.  Psychiatric: She has a normal mood and affect.    Data Reviewed: Basic Metabolic Panel:  Recent Labs Lab 10/15/15 0340 10/15/15 0802 10/15/15 1227 10/15/15 1644 10/15/15 2031 10/17/15 0509  NA 140 139 139 135 140 135  K 3.5 3.4* 3.6 3.2* 3.5 3.6  CL 109 106 107 105 108 103  CO2 25 24 24 24 24 25   GLUCOSE 115* 221* 237* 254* 100* 127*  BUN 17 17 17 18 18 19   CREATININE 1.70* 1.88* 2.00* 1.93* 1.78* 1.63*  CALCIUM 8.9 8.8* 9.0 8.6* 9.2 9.1  MG 2.1  --   --   --   --   --    Liver Function Tests:  Recent Labs Lab 10/15/15 0340 10/15/15 0802 10/15/15 1227 10/15/15 1644 10/15/15 2031  AST 15 15 21 23 21   ALT 13* 13* 14 14 15   ALKPHOS 68 65 67 67 68  BILITOT 0.7 0.4 0.6 0.6 0.7  PROT 7.1 7.0 7.2 6.9 7.3  ALBUMIN 3.5 3.4* 3.5 3.5 3.6   CBC:  Recent Labs Lab 10/14/15 0905 10/15/15 0340 10/16/15 0433  WBC 15.2* 16.7* 12.5*  NEUTROABS 12.8*  --   --   HGB 15.0 12.4 12.9  HCT 47.3* 38.0 39.3  MCV 74.9*  73.2* 73.4*  PLT 305 246 255   Cardiac Enzymes:  Recent Labs Lab 10/14/15 0905  TROPONINI 0.04*    CBG:  Recent Labs Lab 10/16/15 1130 10/16/15 1557 10/16/15 2059 10/17/15 0744 10/17/15 1135  GLUCAP 113* 112* 94 138* 115*    Recent Results (from the past 240 hour(s))  MRSA PCR Screening     Status: None   Collection Time: 10/15/15  5:50 PM  Result Value Ref Range Status   MRSA by PCR NEGATIVE NEGATIVE Final    Comment:        The GeneXpert MRSA Assay (FDA approved for NASAL specimens only), is one component of a comprehensive MRSA colonization surveillance program. It is not intended to diagnose MRSA infection nor to guide or monitor treatment for MRSA infections.      Studies: US Renal  10/15/2015  CLINICAL DATA:  Acute  renal failure EXAM: RENAL / URINARY TRACT ULTRASOUND COMPLETE COMPARISON:  None. FINDINGS: Right Kidney: Length: 12.2 cm. Echogenicity within normal limits. No hydronephrosis. A midpole cyst measures 1.2 x 1.1 cm Left Kidney: Length: 30 in cm in length. Echogenicity within normal limits. No mass or hydronephrosis visualized. Bladder: Appears normal for degree of bladder distention. IMPRESSION: No hydronephrosis. No nephrolithiasis. Cyst in midpole of the right kidney measures 1.2 cm. Electronically Signed   By: Natasha Mead M.D.   On: 10/15/2015 16:06    Scheduled Meds: . amLODipine  10 mg Oral Daily  . antiseptic oral rinse  7 mL Mouth Rinse q12n4p  . aspirin  81 mg Oral Daily  . atorvastatin  40 mg Oral q1800  . carvedilol  12.5 mg Oral BID WC  . chlorhexidine  15 mL Mouth Rinse BID  . cloNIDine  0.1 mg Oral BID  . docusate sodium  100 mg Oral BID  . heparin  5,000 Units Subcutaneous 3 times per day  . insulin aspart  20 Units Subcutaneous TID WC  . insulin aspart  3-15 Units Subcutaneous TID AC  . insulin aspart  3-15 Units Subcutaneous QHS  . insulin detemir  20 Units Subcutaneous BID AC & HS  . sodium chloride  3 mL Intravenous Q12H    Assessment/Plan:  1. Accelerated hypertension, now with numbness on the left side of the face and arm. This is likely hypertensive encephalopathy. Stat dose of clonidine 0.2 mg 1 and increased clonidine to 0.2 mg twice a day. Continue Coreg and Norvasc. Will order mri brain.  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 2. Acute encephalopathy- improved. 3. Hyperglycemic hyperosmolar nonketotic coma. Newly diagnosed diabetes mellitus. I decreased  detemir insulin and  continue aspart insulin. Hemoglobin A1c elevated at 10.2. Sugars trending better at this point. 4. Likely chronic kidney disease stage III. We'll need outpatient follow-up with nephrologist  Code Status:     Code Status Orders        Start     Ordered   10/14/15 1543  Full code    Continuous     10/14/15 1542     Family Communication:Family at bedside  Disposition Plan: Home once blood pressure improved  Consultants:  Nephrology  Endocrinology  Time spent: 20 minutes  Alford Highland  [facility]

## 2015-10-17 NOTE — Progress Notes (Signed)
Dr. Renae Davenport was notified of elevated blood pressure of 180/102, and also complaint of some numbness of her left upper extremity and face.Marland Kitchen.  He gave an order for 0.2mg  of oral clonidine to be given once. This medication has been administered.  He has assessed the patient in her room as well.

## 2015-10-17 NOTE — Progress Notes (Signed)
[facility]  ROUNDING NOTE   Subjective:   Up and out of bed.  UOP not being recorded.  Blood pressure 130/71  Objective:  Vital signs in last 24 hours:  Temp:  [97.7 F (36.5 C)] 97.7 F (36.5 C) (10/29 1600) Pulse Rate:  [71-114] 83 (10/30 0700) Resp:  [10-28] 17 (10/30 0700) BP: (84-175)/(53-146) 130/71 mmHg (10/30 0700) SpO2:  [95 %-100 %] 99 % (10/30 0700) Weight:  [101.8 kg (224 lb 6.9 oz)] 101.8 kg (224 lb 6.9 oz) (10/30 0500)  Weight change: 0.8 kg (1 lb 12.2 oz) Filed Weights   10/14/15 1631 10/16/15 0500 10/17/15 0500  Weight: 101 kg (222 lb 10.6 oz) 101 kg (222 lb 10.6 oz) 101.8 kg (224 lb 6.9 oz)    Intake/Output: I/O last 3 completed shifts: In: 3 [I.V.:3] Out: -    Intake/Output this shift:     Physical Exam: General: NAD, awake, alert.  Head: Normocephalic, atraumatic. Moist oral mucosal membranes  Eyes: Anicteric  Neck: Supple, trachea midline  Lungs:  Clear to auscultation normal effort  Heart: Regular rate and rhythm  Abdomen:  Soft, nontender, BS present  Extremities: no peripheral edema.  Neurologic: Nonfocal, moving all four extremities  Skin: No lesions       Basic Metabolic Panel:  Recent Labs Lab 10/15/15 0340 10/15/15 0802 10/15/15 1227 10/15/15 1644 10/15/15 2031 10/17/15 0509  NA 140 139 139 135 140 135  K 3.5 3.4* 3.6 3.2* 3.5 3.6  CL 109 106 107 105 108 103  CO2 25 24 24 24 24 25   GLUCOSE 115* 221* 237* 254* 100* 127*  BUN 17 17 17 18 18 19   CREATININE 1.70* 1.88* 2.00* 1.93* 1.78* 1.63*  CALCIUM 8.9 8.8* 9.0 8.6* 9.2 9.1  MG 2.1  --   --   --   --   --     Liver Function Tests:  Recent Labs Lab 10/15/15 0340 10/15/15 0802 10/15/15 1227 10/15/15 1644 10/15/15 2031  AST 15 15 21 23 21   ALT 13* 13* 14 14 15   ALKPHOS 68 65 67 67 68  BILITOT 0.7 0.4 0.6 0.6 0.7  PROT 7.1 7.0 7.2 6.9 7.3  ALBUMIN 3.5 3.4* 3.5 3.5 3.6   No results for input(s): LIPASE, AMYLASE in the last 168 hours. No results  for input(s): AMMONIA in the last 168 hours.  CBC:  Recent Labs Lab 10/14/15 0905 10/15/15 0340 10/16/15 0433  WBC 15.2* 16.7* 12.5*  NEUTROABS 12.8*  --   --   HGB 15.0 12.4 12.9  HCT 47.3* 38.0 39.3  MCV 74.9* 73.2* 73.4*  PLT 305 246 255    Cardiac Enzymes:  Recent Labs Lab 10/14/15 0905  TROPONINI 0.04*    BNP: Invalid input(s): POCBNP  CBG:  Recent Labs Lab 10/16/15 0710 10/16/15 1130 10/16/15 1557 10/16/15 2059 10/17/15 0744  GLUCAP 169* 113* 112* 94 138*    Microbiology: Results for orders placed or performed during the hospital encounter of 10/14/15  MRSA PCR Screening     Status: None   Collection Time: 10/15/15  5:50 PM  Result Value Ref Range Status   MRSA by PCR NEGATIVE NEGATIVE Final    Comment:        The GeneXpert MRSA Assay (FDA approved for NASAL specimens only), is one component of a comprehensive MRSA colonization surveillance program. It is not intended to diagnose MRSA infection nor to guide or monitor treatment for MRSA infections.     Coagulation Studies:  Recent  Labs  10/15/15 0340  LABPROT 15.1*  INR 1.17    Urinalysis: No results for input(s): COLORURINE, LABSPEC, PHURINE, GLUCOSEU, HGBUR, BILIRUBINUR, KETONESUR, PROTEINUR, UROBILINOGEN, NITRITE, LEUKOCYTESUR in the last 72 hours.  Invalid input(s): APPERANCEUR    Imaging: US Renal  10/15/2015  CLINICAL DATA:  Acute renal failure EXAM: RENAL / URINARY TRACT ULTRASOUND COMPLETE COMPARISON:  None. FINDINGS: Right Kidney: Length: 12.2 cm. Echogenicity within normal limits. No hydronephrosis. A midpole cyst measures 1.2 x 1.1 cm Left Kidney: Length: 30 in cm in length. Echogenicity within normal limits. No mass or hydronephrosis visualized. Bladder: Appears normal for degree of bladder distention. IMPRESSION: No hydronephrosis. No nephrolithiasis. Cyst in midpole of the right kidney measures 1.2 cm. Electronically Signed   By: Natasha Mead M.D.   On: 10/15/2015 16:06      Medications:   . dexmedetomidine Stopped (10/15/15 1009)   . amLODipine  10 mg Oral Daily  . antiseptic oral rinse  7 mL Mouth Rinse q12n4p  . aspirin  81 mg Oral Daily  . atorvastatin  40 mg Oral q1800  . carvedilol  12.5 mg Oral BID WC  . chlorhexidine  15 mL Mouth Rinse BID  . cloNIDine  0.1 mg Oral BID  . docusate sodium  100 mg Oral BID  . heparin  5,000 Units Subcutaneous 3 times per day  . insulin aspart  20 Units Subcutaneous TID WC  . insulin aspart  3-15 Units Subcutaneous TID AC  . insulin aspart  3-15 Units Subcutaneous QHS  . insulin detemir  20 Units Subcutaneous BID AC & HS  . sodium chloride  3 mL Intravenous Q12H   acetaminophen **OR** acetaminophen, alum & mag hydroxide-simeth, bisacodyl, labetalol, ondansetron **OR** ondansetron (ZOFRAN) IV, traZODone  Assessment/ Plan:  34 y.o. black female  with  hypertension, was admitted to [facility] on 10/14/2015 with newly diagnosed diabetes mellitus type 2, altered mental status, acute renal failure, hypokalemia.  1. Acute renal failure due to ATN with proteinuria: unclear if underlying chronic kidney disease from hypertensive nephrosclerosis and diabetic nephropathy. No clear history of NSAID use.  - Creatinine improved. Volume status acceptable. Urine output not recorded.  - Continue to monitor renal function. Renally dose medications.  - Labs reviewed with patient. No indication for dialysis.   2. Hypertension: blood pressure at goal. Previously on nicardipine gtt.  - Currently on amlodipine and carvedilol.  - Will need to add an ACE-I/ARB when more stable.   3. 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  - Agree with glucose control.    LOS: 3 Sajad Glander 10/30/20169:10 AM

## 2015-10-18 ENCOUNTER — Inpatient Hospital Stay: Payer: MEDICAID

## 2015-10-18 LAB — BASIC METABOLIC PANEL
ANION GAP: 7 (ref 5–15)
BUN: 24 mg/dL — ABNORMAL HIGH (ref 6–20)
CALCIUM: 8.8 mg/dL — AB (ref 8.9–10.3)
CO2: 26 mmol/L (ref 22–32)
CREATININE: 1.69 mg/dL — AB (ref 0.44–1.00)
Chloride: 104 mmol/L (ref 101–111)
GFR, EST AFRICAN AMERICAN: 44 mL/min — AB (ref >60)
GFR, EST NON AFRICAN AMERICAN: 38 mL/min — AB (ref >60)
GLUCOSE: 103 mg/dL — AB (ref 65–99)
Potassium: 3.4 mmol/L — ABNORMAL LOW (ref 3.5–5.1)
Sodium: 137 mmol/L (ref 135–145)

## 2015-10-18 LAB — GLUCOSE, CAPILLARY
GLUCOSE-CAPILLARY: 129 mg/dL — AB (ref 65–99)
Glucose-Capillary: 118 mg/dL — ABNORMAL HIGH (ref 65–99)
Glucose-Capillary: 109 mg/dL — ABNORMAL HIGH (ref 65–99)

## 2015-10-18 MED ORDER — INSULIN DETEMIR 100 UNIT/ML ~~LOC~~ SOLN
18.0000 [IU] | Freq: Two times a day (BID) | SUBCUTANEOUS | Status: DC
  Administered 2015-10-18 – 2015-10-19 (×2): 18 [IU] via SUBCUTANEOUS
  Filled 2015-10-18 (×3): qty 0.18

## 2015-10-18 MED ORDER — POTASSIUM CHLORIDE CRYS ER 20 MEQ PO TBCR
40.0000 meq | EXTENDED_RELEASE_TABLET | Freq: Once | ORAL | Status: AC
  Administered 2015-10-18: 40 meq via ORAL
  Filled 2015-10-18: qty 2

## 2015-10-18 NOTE — Progress Notes (Signed)
Patient ID: Sabrina Davenport, female   DOB: 10-20-1981, 34 y.o.   MRN: [id number]  [facility] PRO[facility]  PCP: No primary care provider on file.  HPI/Subjective: Patient feeling better. No further symptoms down the left arm. Offers no complaints.  Objective: Filed Vitals:   10/18/15 1258  BP: 121/87  Pulse: 75  Temp: 98.2 F (36.8 C)  Resp: 17    Filed Weights   10/16/15 0500 10/17/15 0500 10/18/15 0500  Weight: 101 kg (222 lb 10.6 oz) 101.8 kg (224 lb 6.9 oz) 104.418 kg (230 lb 3.2 oz)    ROS: Review of Systems  Constitutional: Negative for fever and chills.  Eyes: Negative for blurred vision.  Respiratory: Negative for cough and shortness of breath.   Cardiovascular: Negative for chest pain.  Gastrointestinal: Negative for nausea, vomiting, abdominal pain, diarrhea and constipation.  Genitourinary: Negative for dysuria.  Musculoskeletal: Negative for joint pain.  Neurological: Negative for dizziness, tingling, sensory change, focal weakness and headaches.   Exam: Physical Exam  Constitutional: She is oriented to person, place, and time.  HENT:  Nose: No mucosal edema.  Mouth/Throat: No oropharyngeal exudate or posterior oropharyngeal edema.  Eyes: Conjunctivae, EOM and lids are normal. Pupils are equal, round, and reactive to light.  Neck: No JVD present. Carotid bruit is not present. No edema present. No thyroid mass and no thyromegaly present.  Cardiovascular: S1 normal and S2 normal.  Exam reveals no gallop.   No murmur heard. Pulses:      Dorsalis pedis pulses are 2+ on the right side, and 2+ on the left side.  Respiratory: No respiratory distress. She has no wheezes. She has no rhonchi. She has no rales.  GI: Soft. Bowel sounds are normal. There is no tenderness.  Musculoskeletal:       Right ankle: She exhibits swelling.       Left ankle: She exhibits swelling.  Lymphadenopathy:    She has no cervical adenopathy.  Neurological: She is  alert and oriented to person, place, and time. No cranial nerve deficit.  Skin: Skin is warm. No rash noted. Nails show no clubbing.  Psychiatric: She has a normal mood and affect.    Data Reviewed: Basic Metabolic Panel:  Recent Labs Lab 10/15/15 0340  10/15/15 1227 10/15/15 1644 10/15/15 2031 10/17/15 0509 10/18/15 0624  NA 140  < > 139 135 140 135 137  K 3.5  < > 3.6 3.2* 3.5 3.6 3.4*  CL 109  < > 107 105 108 103 104  CO2 25  < > 24 24 24 25 26   GLUCOSE 115*  < > 237* 254* 100* 127* 103*  BUN 17  < > 17 18 18 19  24*  CREATININE 1.70*  < > 2.00* 1.93* 1.78* 1.63* 1.69*  CALCIUM 8.9  < > 9.0 8.6* 9.2 9.1 8.8*  MG 2.1  --   --   --   --   --   --   < > = values in this interval not displayed. Liver Function Tests:  Recent Labs Lab 10/15/15 0340 10/15/15 0802 10/15/15 1227 10/15/15 1644 10/15/15 2031  AST 15 15 21 23 21   ALT 13* 13* 14 14 15   ALKPHOS 68 65 67 67 68  BILITOT 0.7 0.4 0.6 0.6 0.7  PROT 7.1 7.0 7.2 6.9 7.3  ALBUMIN 3.5 3.4* 3.5 3.5 3.6   CBC:  Recent Labs Lab 10/14/15 0905 10/15/15 0340 10/16/15 0433  WBC 15.2* 16.7* 12.5*  NEUTROABS 12.8*  --   --  HGB 15.0 12.4 12.9  HCT 47.3* 38.0 39.3  MCV 74.9* 73.2* 73.4*  PLT 305 246 255   Cardiac Enzymes:  Recent Labs Lab 10/14/15 0905  TROPONINI 0.04*    CBG:  Recent Labs Lab 10/17/15 1448 10/17/15 1633 10/17/15 2107 10/18/15 0724 10/18/15 1145  GLUCAP 84 119* 96 118* 129*    Recent Results (from the past 240 hour(s))  MRSA PCR Screening     Status: None   Collection Time: 10/15/15  5:50 PM  Result Value Ref Range Status   MRSA by PCR NEGATIVE NEGATIVE Final    Comment:        The GeneXpert MRSA Assay (FDA approved for NASAL specimens only), is one component of a comprehensive MRSA colonization surveillance program. It is not intended to diagnose MRSA infection nor to guide or monitor treatment for MRSA infections.       Scheduled Meds: . amLODipine  10 mg Oral Daily   . antiseptic oral rinse  7 mL Mouth Rinse q12n4p  . aspirin  81 mg Oral Daily  . atorvastatin  40 mg Oral q1800  . carvedilol  12.5 mg Oral BID WC  . cloNIDine  0.2 mg Oral BID  . docusate sodium  100 mg Oral BID  . heparin  5,000 Units Subcutaneous 3 times per day  . insulin aspart  10 Units Subcutaneous TID WC  . insulin aspart  3-15 Units Subcutaneous TID AC  . insulin aspart  3-15 Units Subcutaneous QHS  . insulin detemir  18 Units Subcutaneous BID AC & HS  . sodium chloride  3 mL Intravenous Q12H    Assessment/Plan:  1. Accelerated hypertension, hypertensive encephalopathy. Continue Coreg, Norvasc and clonidine. MRI of the brain should be done this evening. Initial CT scan showed possibility of stroke. Will confirm this or rule this out with the MRI. Continue aspirin and statin. 2. Acute encephalopathy- improved. 3. Hyperglycemic hyperosmolar nonketotic coma. Newly diagnosed diabetes mellitus. I decreased  detemir insulin and  continue aspart insulin. Hemoglobin A1c elevated at 10.2. Sugars trending better at this point. 4. Likely chronic kidney disease stage III. We'll need outpatient follow-up with nephrologist  Code Status:     Code Status Orders        Start     Ordered   10/14/15 1543  Full code   Continuous     10/14/15 1542     Family Communication:Family at bedside  Disposition Plan: Home once blood pressure improved  Consultants:  Nephrology  Endocrinology  Time spent: 20 minutes  Alford Highland  [facility]

## 2015-10-18 NOTE — Care Management (Addendum)
Spoke with patient for discharge planning after reviewing notes from Ermalene SearingNann Greene RN NCM. Again patient was not enthusiastic or engaged in discussing her discharge plan.  Patient was unsure if she had been set up with services or Glucometer. Patient stated that their were "some papers in her folder which was laying on he room counter and that she had been given "a box with something in it" that was in her bag. With pateint permission I looked in her bag and there was a glucometer, there was a Engineer, [employer] program certificate in her folder. I explained this to the patient . We discussed use of glucometer and also the importance of following up with a PCP after discharge. Patient will need PCP follow up and also a way to obtain her medications. 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  Patient stated that she can ambulate fine and without difficulty. Continue to follow.

## 2015-10-18 NOTE — Progress Notes (Signed)
Sabrina FurbishJennifer R Davenport is a 34 y.o. female admitted with hypertensive emergency, acute renal failure, and newly diagnosed uncontrolled diabetes with hyperosmolar hyperglycemic state.  She was seen this morning and reported feeling well in general.  She is on a low carb diet and is tolerating her diet. No nausea or vomiting. She has done well on NPH and novolog insulin without episodes of hypoglycemia. Blood sugars have been well controlled. She has been seen by the Diabetes Coordinators and has had inpatient education.    Medical history Type 2 diabetes HTN Obesity Tobacco use  Surgical history History reviewed. No pertinent past surgical history.   Medications . amLODipine  5 mg Oral Daily  . antiseptic oral rinse  7 mL Mouth Rinse q12n4p  . aspirin  81 mg Oral Daily  . atorvastatin  40 mg Oral q1800  . carvedilol  6.25 mg Oral BID WC  . chlorhexidine  15 mL Mouth Rinse BID  . docusate sodium  100 mg Oral BID  . heparin  5,000 Units Subcutaneous 3 times per day  . insulin regular 100 units/ml IV       Social history Social History  Substance Use Topics  . Smoking status: Current Every Day Smoker -- 0.50 packs/day for 10 years    Types: Cigarettes  . Smokeless tobacco: None  . Alcohol Use: No     Family history Family History  Problem Relation Age of Onset  . Hypertension Mother     Review of systems As in HPI, otherwise 10 pt ROS was negative  Physical Exam BP 121/87 mmHg  Pulse 75  Temp(Src) 98.2 F (36.8 C) (Oral)  Resp 17  Ht 5\' 8"  (1.727 m)  Wt 104.418 kg (230 lb 3.2 oz)  BMI 35.01 kg/m2  SpO2 100%  GEN: well developed, well nourished female, in NAD. HEENT: No proptosis, EOMI, lid lag or stare. Oropharynx is clear.  RESPIRATORY: breathing unlabored on room air ABD: soft, NT/ND EXT: no peripheral edema PSYC: alert and oriented, good insight  Labs Results for orders placed or performed during the hospital encounter of 10/14/15 (from the past 24 hour(s))   Glucose, capillary     Status: Abnormal   Collection Time: 10/17/15  4:33 PM  Result Value Ref Range   Glucose-Capillary 119 (H) 65 - 99 mg/dL  Glucose, capillary     Status: None   Collection Time: 10/17/15  9:07 PM  Result Value Ref Range   Glucose-Capillary 96 65 - 99 mg/dL  Basic metabolic panel     Status: Abnormal   Collection Time: 10/18/15  6:24 AM  Result Value Ref Range   Sodium 137 135 - 145 mmol/L   Potassium 3.4 (L) 3.5 - 5.1 mmol/L   Chloride 104 101 - 111 mmol/L   CO2 26 22 - 32 mmol/L   Glucose, Bld 103 (H) 65 - 99 mg/dL   BUN 24 (H) 6 - 20 mg/dL   Creatinine, Ser 6.961.69 (H) 0.44 - 1.00 mg/dL   Calcium 8.8 (L) 8.9 - 10.3 mg/dL   GFR calc non Af Amer 38 (L) >60 mL/min   GFR calc Af Amer 44 (L) >60 mL/min   Anion gap 7 5 - 15  Glucose, capillary     Status: Abnormal   Collection Time: 10/18/15  7:24 AM  Result Value Ref Range   Glucose-Capillary 118 (H) 65 - 99 mg/dL   Comment 1 Notify RN   Glucose, capillary     Status: Abnormal   Collection  Time: 10/18/15 11:45 AM  Result Value Ref Range   Glucose-Capillary 129 (H) 65 - 99 mg/dL   Comment 1 Notify RN     Assessment 1. Uncontrolled diabetes, new diagnosis 2. Hyperosmolar hyperglycemic state - resolved 3. Acute kidney injury   4. 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 Obesity 6. Tobacco dependence  Plan 1. Continue the current regimen of insulin: NPH 18 units twice daily; Novolog 10 units with each meal three times daily plus correction. 2. Upon discharge, would transition to regimen of 16 units regular insulin before each meal three times daily and 16 unit NPH at once nightly at bedtime. She does not have insurance and this is the most cost-effective option for her.  3. Check fingerstick BG AC, HS.  4. Low carb diet. 5. Would not add metformin at this point given renal impairment. Can be considered outpatient. 6. She will f/u with me within 1-2 weeks of discharge. She will need Rx for all testing supplies, insulin,  and syringes. Thank you for allowing me to participate in this patient's care.  Doylene Canning, MD [facility]

## 2015-10-18 NOTE — Progress Notes (Signed)
Follow up with patient regarding diabetes teaching.  Spoke to patient and she states that she has not self-administered insulin yet.  Note that patient has been taught and was given meter. Spoke with patient and RN and asked RN to allow patient to self-administer all insulin doses.  Patient was agreeable to this.  Also patient will need to practice pricking her finger and placing blood on the strip.  She has been given meter by case management. Will likely need HH and close follow-up since she is new to diabetes and insulin.  Discussed with RN.  Will follow-up on 10-19-15.  Thanks, Beryl MeagerJenny Mirko Tailor, RN, BC-ADM Inpatient Diabetes Coordinator Pager [phone] (8a-5p)

## 2015-10-19 ENCOUNTER — Encounter: Payer: Self-pay | Admitting: Emergency Medicine

## 2015-10-19 ENCOUNTER — Emergency Department
Admission: EM | Admit: 2015-10-19 | Discharge: 2015-10-20 | Disposition: A | Discharge status: Home / Self Care | Payer: Self-pay | Attending: Emergency Medicine | Admitting: Emergency Medicine

## 2015-10-19 DIAGNOSIS — Z7982 Long term (current) use of aspirin: Secondary | ICD-10-CM | POA: Insufficient documentation

## 2015-10-19 DIAGNOSIS — I10 Essential (primary) hypertension: Secondary | ICD-10-CM | POA: Insufficient documentation

## 2015-10-19 DIAGNOSIS — R200 Anesthesia of skin: Secondary | ICD-10-CM | POA: Insufficient documentation

## 2015-10-19 DIAGNOSIS — R410 Disorientation, unspecified: Secondary | ICD-10-CM

## 2015-10-19 DIAGNOSIS — F419 Anxiety disorder, unspecified: Secondary | ICD-10-CM | POA: Insufficient documentation

## 2015-10-19 DIAGNOSIS — Z79899 Other long term (current) drug therapy: Secondary | ICD-10-CM | POA: Insufficient documentation

## 2015-10-19 DIAGNOSIS — F17210 Nicotine dependence, cigarettes, uncomplicated: Secondary | ICD-10-CM | POA: Insufficient documentation

## 2015-10-19 DIAGNOSIS — R202 Paresthesia of skin: Secondary | ICD-10-CM | POA: Insufficient documentation

## 2015-10-19 DIAGNOSIS — Z794 Long term (current) use of insulin: Secondary | ICD-10-CM | POA: Insufficient documentation

## 2015-10-19 LAB — GLUCOSE, CAPILLARY
Glucose-Capillary: 144 mg/dL — ABNORMAL HIGH (ref 65–99)
Glucose-Capillary: 132 mg/dL — ABNORMAL HIGH (ref 65–99)
Glucose-Capillary: 114 mg/dL — ABNORMAL HIGH (ref 65–99)

## 2015-10-19 LAB — CBC WITH DIFFERENTIAL/PLATELET
BASOS PCT: 0 %
Basophils Absolute: 0 10*3/uL (ref 0–0.1)
Eosinophils Absolute: 0.3 10*3/uL (ref 0–0.7)
Eosinophils Relative: 3 %
HEMATOCRIT: 38.2 % (ref 35.0–47.0)
HEMOGLOBIN: 12.6 g/dL (ref 12.0–16.0)
LYMPHS ABS: 3.5 10*3/uL (ref 1.0–3.6)
Lymphocytes Relative: 32 %
MCH: 24.1 pg — ABNORMAL LOW (ref 26.0–34.0)
MCHC: 33 g/dL (ref 32.0–36.0)
MCV: 73.2 fL — ABNORMAL LOW (ref 80.0–100.0)
MONOS PCT: 8 %
Monocytes Absolute: 0.9 10*3/uL (ref 0.2–0.9)
NEUTROS ABS: 6.4 10*3/uL (ref 1.4–6.5)
NEUTROS PCT: 57 %
Platelets: 261 10*3/uL (ref 150–440)
RBC: 5.21 MIL/uL — AB (ref 3.80–5.20)
RDW: 13.6 % (ref 11.5–14.5)
WBC: 11.1 10*3/uL — AB (ref 3.6–11.0)

## 2015-10-19 LAB — PROTEIN / CREATININE RATIO, URINE
Creatinine, Urine: 135 mg/dL
Protein Creatinine Ratio: 0.17 mg/mg{creat} — ABNORMAL HIGH (ref 0.00–0.15)
Total Protein, Urine: 23 mg/dL

## 2015-10-19 MED ORDER — INSULIN REGULAR HUMAN 100 UNIT/ML IJ SOLN: 10.0000 [IU] | Freq: Three times a day (TID) | INTRAMUSCULAR | Status: AC

## 2015-10-19 MED ORDER — INSULIN DETEMIR 100 UNIT/ML ~~LOC~~ SOLN
18.0000 [IU] | Freq: Two times a day (BID) | SUBCUTANEOUS | Status: DC
  Filled 2015-10-19: qty 0.18

## 2015-10-19 MED ORDER — INSULIN ASPART 100 UNIT/ML ~~LOC~~ SOLN: 10.0000 [IU] | Freq: Three times a day (TID) | SUBCUTANEOUS | Status: DC

## 2015-10-19 MED ORDER — INSULIN NPH (HUMAN) (ISOPHANE) 100 UNIT/ML ~~LOC~~ SUSP: 18.0000 [IU] | Freq: Two times a day (BID) | SUBCUTANEOUS | Status: DC

## 2015-10-19 MED ORDER — LISINOPRIL 10 MG PO TABS
10.0000 mg | ORAL_TABLET | Freq: Two times a day (BID) | ORAL | Status: DC
  Administered 2015-10-19: 10 mg via ORAL
  Filled 2015-10-19: qty 1

## 2015-10-19 MED ORDER — CLONIDINE HCL 0.2 MG PO TABS: 0.2000 mg | ORAL_TABLET | Freq: Two times a day (BID) | ORAL | Status: AC

## 2015-10-19 MED ORDER — LABETALOL HCL 5 MG/ML IV SOLN
10.0000 mg | INTRAVENOUS | Status: DC | PRN
  Administered 2015-10-19 (×2): 10 mg via INTRAVENOUS
  Filled 2015-10-19 (×3): qty 4

## 2015-10-19 MED ORDER — ASPIRIN 81 MG PO CHEW: 81.0000 mg | CHEWABLE_TABLET | Freq: Every day | ORAL | Status: AC

## 2015-10-19 MED ORDER — AMLODIPINE BESYLATE 10 MG PO TABS: 10.0000 mg | ORAL_TABLET | Freq: Every day | ORAL | Status: AC

## 2015-10-19 MED ORDER — CARVEDILOL 12.5 MG PO TABS: 12.5000 mg | ORAL_TABLET | Freq: Two times a day (BID) | ORAL | Status: AC

## 2015-10-19 MED ORDER — INSULIN REGULAR HUMAN 100 UNIT/ML IJ SOLN: 10.0000 [IU] | Freq: Three times a day (TID) | INTRAMUSCULAR | Status: DC

## 2015-10-19 MED ORDER — PRAVASTATIN SODIUM 40 MG PO TABS: 40.0000 mg | ORAL_TABLET | Freq: Every day | ORAL | Status: AC

## 2015-10-19 MED ORDER — INSULIN NPH (HUMAN) (ISOPHANE) 100 UNIT/ML ~~LOC~~ SUSP: 18.0000 [IU] | Freq: Two times a day (BID) | SUBCUTANEOUS | Status: AC

## 2015-10-19 MED ORDER — LISINOPRIL 10 MG PO TABS: 10.0000 mg | ORAL_TABLET | Freq: Two times a day (BID) | ORAL | Status: AC

## 2015-10-19 NOTE — Care Management (Signed)
Spoke with patient again who has not filled out applications for [facility] or Medication Management. She has the certificate for Match program and a list of participating pharmacies. Meds will be filled at a cost for $3 per medication. Patient has difficulty understanding what needs to be done as far as applying for these programs. I will address this with her mother when she picks her up this afternoon.  [facility] with Leone [facility]ebah [facility].  I have made her a follow up with [facility] for 10 Nov at 1pm . This was placed in diecharge instructions by unit secretary. Patient must bring last 3 pay stubs or Letter of support from her parents and photo ID. I will provide a set of written instructions to patient and family of [facility] and med management application process.

## 2015-10-19 NOTE — Progress Notes (Signed)
Per Dr. Renae GlossWieting, Okay to give Catapres early this morning so we can re-assess later. Will continue to monitor.

## 2015-10-19 NOTE — ED Notes (Addendum)
Patient ambulatory to triage with steady gait, without difficulty or distress noted; pt d/c from hosp this am for "diabetic coma"; returning this evening for elevated BP (181/104) at home and numbness to hands

## 2015-10-19 NOTE — Progress Notes (Signed)
[facility]  ROUNDING NOTE   Subjective:   Patient is overall doing well. No complaint of shortness of breath No leg swelling Blood pressure control is very variable at present Serum creatinine down to 1.69/GFR of 44 Probably baseline at present  Objective:  Vital signs in last 24 hours:  Temp:  [98 F (36.7 C)-98.4 F (36.9 C)] 98.4 F (36.9 C) (11/01 0745) Pulse Rate:  [72-95] 74 (11/01 0806) Resp:  [17-20] 20 (11/01 0507) BP: (121-174)/(74-123) 156/108 mmHg (11/01 1017) SpO2:  [98 %-100 %] 100 % (11/01 0745) Weight:  [104.509 kg (230 lb 6.4 oz)-105.461 kg (232 lb 8 oz)] 105.461 kg (232 lb 8 oz) (11/01 0631)  Weight change: 0.091 kg (3.2 oz) Filed Weights   10/18/15 0500 10/19/15 0500 10/19/15 0631  Weight: 104.418 kg (230 lb 3.2 oz) 104.509 kg (230 lb 6.4 oz) 105.461 kg (232 lb 8 oz)    Intake/Output: I/O last 3 completed shifts: In: 483 [P.O.:480; I.V.:3] Out: -    Intake/Output this shift:  Total I/O In: -  Out: 1 [Urine:1]  Physical Exam: General: NAD, awake, alert.  Head: Normocephalic, atraumatic. Moist oral mucosal membranes  Eyes: Anicteric  Neck: Supple, trachea midline  Lungs:  Clear to auscultation normal effort  Heart: Regular rate and rhythm  Abdomen:  Soft, nontender, BS present  Extremities: no peripheral edema.  Neurologic: Nonfocal, moving all four extremities  Skin: No lesions       Basic Metabolic Panel:  Recent Labs Lab 10/15/15 0340  10/15/15 1227 10/15/15 1644 10/15/15 2031 10/17/15 0509 10/18/15 0624  NA 140  < > 139 135 140 135 137  K 3.5  < > 3.6 3.2* 3.5 3.6 3.4*  CL 109  < > 107 105 108 103 104  CO2 25  < > GLUCOSE 115*  < > 237* 254* 100* 127* 103*  BUN 17  < > 24*  CREATININE 1.70*  < > 2.00* 1.93* 1.78* 1.63* 1.69*  CALCIUM 8.9  < > 9.0 8.6* 9.2 9.1 8.8*  MG 2.1  --   --   --   --   --   --   < > = values in this interval not displayed.  Liver Function Tests:  Recent  Labs Lab 10/15/15 0340 10/15/15 0802 10/15/15 1227 10/15/15 1644 10/15/15 2031  AST ALT 13* 13* ALKPHOS 68 65 67 67 68  BILITOT 0.7 0.4 0.6 0.6 0.7  PROT 7.1 7.0 7.2 6.9 7.3  ALBUMIN 3.5 3.4* 3.5 3.5 3.6   No results for input(s): LIPASE, AMYLASE in the last 168 hours. No results for input(s): AMMONIA in the last 168 hours.  CBC:  Recent Labs Lab 10/14/15 0905 10/15/15 0340 10/16/15 0433 10/19/15 0444  WBC 15.2* 16.7* 12.5* 11.1*  NEUTROABS 12.8*  --   --  6.4  HGB 15.0 12.4 12.9 12.6  HCT 47.3* 38.0 39.3 38.2  MCV 74.9* 73.2* 73.4* 73.2*  PLT 305 246 255 261    Cardiac Enzymes:  Recent Labs Lab 10/14/15 0905  TROPONINI 0.04*    BNP: Invalid input(s): POCBNP  CBG:  Recent Labs Lab 10/18/15 0724 10/18/15 1145 10/18/15 1639 10/18/15 2219 10/19/15 0721  GLUCAP 118* 129* 109* 144* 132*    Microbiology: Results for orders placed or performed during the hospital encounter of 10/14/15  MRSA PCR Screening     Status: None  Collection Time: 10/15/15  5:50 PM  Result Value Ref Range Status   MRSA by PCR NEGATIVE NEGATIVE Final    Comment:        The GeneXpert MRSA Assay (FDA approved for NASAL specimens only), is one component of a comprehensive MRSA colonization surveillance program. It is not intended to diagnose MRSA infection nor to guide or monitor treatment for MRSA infections.     Coagulation Studies: No results for input(s): LABPROT, INR in the last 72 hours.  Urinalysis: No results for input(s): COLORURINE, LABSPEC, PHURINE, GLUCOSEU, HGBUR, BILIRUBINUR, KETONESUR, PROTEINUR, UROBILINOGEN, NITRITE, LEUKOCYTESUR in the last 72 hours.  Invalid input(s): APPERANCEUR    Imaging: No results found.   Medications:     . amLODipine  10 mg Oral Daily  . antiseptic oral rinse  7 mL Mouth Rinse q12n4p  . aspirin  81 mg Oral Daily  . atorvastatin  40 mg Oral q1800  . carvedilol  12.5 mg Oral BID WC  .  cloNIDine  0.2 mg Oral BID  . docusate sodium  100 mg Oral BID  . heparin  5,000 Units Subcutaneous 3 times per day  . insulin aspart  10 Units Subcutaneous TID WC  . insulin aspart  3-15 Units Subcutaneous TID AC  . insulin aspart  3-15 Units Subcutaneous QHS  . insulin detemir  18 Units Subcutaneous BID AC & HS  . lisinopril  10 mg Oral BID  . sodium chloride  3 mL Intravenous Q12H   acetaminophen **OR** acetaminophen, alum & mag hydroxide-simeth, bisacodyl, labetalol, ondansetron **OR** ondansetron (ZOFRAN) IV, traZODone  Assessment/ Plan:  34 y.o. black female  with  hypertension, [facility] with newly diagnosed diabetes mellitus type 2, altered mental status, acute renal failure, hypokalemia.  1. Acute renal failure due to ATN with proteinuria: unclear cause of chronic kidney disease is likely from hypertensive nephrosclerosis and diabetic nephropathy. No clear history of NSAID use. Hemoglobin A1c 10.2% - Creatinine improved. Volume status acceptable. Urine output not recorded.  - Continue to monitor renal function. Renally dose medications.  - Labs reviewed with patient.  - Follow-up outpatient - We will order screening ANA  2. Hypertension: blood pressure at goal. Previously on nicardipine gtt.  - Currently on amlodipine and carvedilol.  - Serum creatinine appears to have stabilized. Okay to add an ACE-I - Obtain urine protein to creatinine ratio  3. 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  - Agree with glucose control.    LOS: 5 Sabrina Davenport 11/1/201611:10 AM

## 2015-10-19 NOTE — Progress Notes (Signed)
Follow- up with patient and RN regarding patient's progress with insulin teaching.  Patient was able to give evening and morning dose of insulin.  Patient admits that she is still having a hard time with putting needle in the skin.  Rn states she is hesitant.  She will continue to practice with all insulin injections while in the hospital.  Encouraged RN to also include parents in insulin teaching and administration to give patient extra support.  May benefit from Home Health RN to assist with diabetes teaching also.   Patient states she is feeling better about checking her blood sugars and asked great questions about the amount of insulin she will be taking.  Explained the difference between the intermediate acting insulin versus the short acting insulins.  Note that patient will be taking 4 shots a day once discharged and reiterated the importance of diabetes management.  Thanks, Beryl MeagerJenny Lashawndra Lampkins, RN, BC-ADM Inpatient Diabetes Coordinator Pager [phone] (8a-5p)

## 2015-10-19 NOTE — Progress Notes (Signed)
Nutrition Follow-up     INTERVENTION:  Nutrition diet education: Discussed Plate Method with pt and family member at bedside.  Discussed sugary beverages, meal planning, sources of carbohydrates, alternative sweetners.  Pt able to provide examples of better choice beverages but less able to discuss carbohydrates.  Will need continued support once discharged.  Teachback method used. Expect fair compliance   NUTRITION DIAGNOSIS:   Food and nutrition related knowledge deficit related to  (new dx of DM) as evidenced by  (consult for diet education).   GOAL:   Patient will meet greater than or equal to 90% of their needs    MONITOR:    (Energy Intake, Knowledge, Anthropometrics, Electrolyte/Renal Profile, Glucose Profile)  REASON FOR ASSESSMENT:   Consult Diet education  ASSESSMENT:       Current Nutrition: eating well and tolerating meals    Scheduled Medications:  . amLODipine  10 mg Oral Daily  . antiseptic oral rinse  7 mL Mouth Rinse q12n4p  . aspirin  81 mg Oral Daily  . atorvastatin  40 mg Oral q1800  . carvedilol  12.5 mg Oral BID WC  . cloNIDine  0.2 mg Oral BID  . docusate sodium  100 mg Oral BID  . heparin  5,000 Units Subcutaneous 3 times per day  . insulin aspart  10 Units Subcutaneous TID WC  . insulin aspart  3-15 Units Subcutaneous TID AC  . insulin aspart  3-15 Units Subcutaneous QHS  . insulin detemir  18 Units Subcutaneous BID AC & HS  . lisinopril  10 mg Oral BID  . sodium chloride  3 mL Intravenous Q12H     Electrolyte/Renal Profile and Glucose Profile:   Recent Labs Lab 10/15/15 0340  10/15/15 2031 10/17/15 0509 10/18/15 0624  NA 140  < > 140 135 137  K 3.5  < > 3.5 3.6 3.4*  CL 109  < > 108 103 104  CO2 25  < > 24 25 26   BUN 17  < > 18 19 24*  CREATININE 1.70*  < > 1.78* 1.63* 1.69*  CALCIUM 8.9  < > 9.2 9.1 8.8*  MG 2.1  --   --   --   --   GLUCOSE 115*  < > 100* 127* 103*  < > = values in this interval not displayed. Protein  Profile:  Recent Labs Lab 10/15/15 1227 10/15/15 1644 10/15/15 2031  ALBUMIN 3.5 3.5 3.6      Weight Trend since Admission: Filed Weights   10/18/15 0500 10/19/15 0500 10/19/15 0631  Weight: 230 lb 3.2 oz (104.418 kg) 230 lb 6.4 oz (104.509 kg) 232 lb 8 oz (105.461 kg)      Diet Order:  Diet heart healthy/carb modified Room service appropriate?: Yes; Fluid consistency:: Thin  Skin:   reviewed   Height:   Ht Readings from Last 1 Encounters:  10/14/15 5\' 8"  (1.727 m)    Weight:   Wt Readings from Last 1 Encounters:  10/19/15 232 lb 8 oz (105.461 kg)     BMI:  Body mass index is 35.36 kg/(m^2).   EDUCATION NEEDS:   Education needs addressed  LOW Care Level  Denzel Etienne B. Freida BusmanAllen, RD, LDN [phone] (pager)

## 2015-10-19 NOTE — Discharge Summary (Signed)
[facility] [place] at [facility]   PATIENT NAME: Sabrina Davenport    MR#:  [id number]  DATE OF BIRTH:  06/13/1981  DATE OF ADMISSION:  10/14/2015 ADMITTING PHYSICIAN: Delfino Lovett, MD  DATE OF DISCHARGE: 10/19/2015  PRIMARY CARE PHYSICIAN: [facility]   ADMISSION DIAGNOSIS:  Mental status change [R41.82] Elevated troponin I level [R79.89] Hyperosmolar non-ketotic state in patient with type 2 diabetes mellitus (HCC) [E11.01] Cerebral infarction due to unspecified mechanism [I63.9] Acute renal failure, unspecified acute renal failure type (HCC) [N17.9] Altered mental status, unspecified altered mental status type [R41.82]  DISCHARGE DIAGNOSIS:  Active Problems:   Encephalopathy acute   SECONDARY DIAGNOSIS:   Past Medical History  Diagnosis Date  . Hypertension     HOSPITAL COURSE:   1. Hypertensive encephalopathy- patient had elevated blood pressure throughout the entire hospital course. The patient is very concerned about her blood pressure and I think that may be elevating the number readings. I took her blood pressure manually myself prior to discharge and it was 138/84. She is on Norvasc, Coreg, clonidine and lisinopril was added. 2. Acute encephalopathy likely secondary from hypertensive encephalopathy. CT scan showed a probable acute infarct in the anterior limb of the right external capsule. I tried to order an MRI of the brain to confirm a stroke or not and the patient could not tolerate this exam. Patient will be treated so for an acute stroke with aspirin and statin. Because of cost reasons pravastatin was prescribed instead of atorvastatin or Crestor. 3. Hyperglycemic hyperosmolar nonketotic coma. Patient was initially started on insulin drip. Here in the hospital was on Levemir and the Apidra insulin. Secondary to cost reasons she was switched over to NPH and regular insulin. Sugars were actually on the lower side and I had a cut back on  medications. 4. Chronic kidney disease stage III. Creatinine did not improve during the hospitalization. Likely has chronic kidney disease secondary to long-standing hypertension  DISCHARGE CONDITIONS:   Satisfactory  CONSULTS OBTAINED:  Treatment Team:  Munsoor Cherylann Ratel, MD  DRUG ALLERGIES:  No Known Allergies  DISCHARGE MEDICATIONS:   Current Discharge Medication List    START taking these medications   Details  amLODipine (NORVASC) 10 MG tablet Take 1 tablet (10 mg total) by mouth daily. Qty: 30 tablet, Refills: 0    aspirin 81 MG chewable tablet Chew 1 tablet (81 mg total) by mouth daily. Qty: 30 tablet, Refills: 0    carvedilol (COREG) 12.5 MG tablet Take 1 tablet (12.5 mg total) by mouth 2 (two) times daily with a meal. Qty: 60 tablet, Refills: 0    cloNIDine (CATAPRES) 0.2 MG tablet Take 1 tablet (0.2 mg total) by mouth 2 (two) times daily. Qty: 60 tablet, Refills: 0    insulin NPH Human (HUMULIN N,NOVOLIN N) 100 UNIT/ML injection Inject 0.18 mLs (18 Units total) into the skin 2 (two) times daily at 8 am and 10 pm. Qty: 10 mL, Refills: 2    insulin regular (NOVOLIN R,HUMULIN R) 100 units/mL injection Inject 0.1 mLs (10 Units total) into the skin 3 (three) times daily before meals. Qty: 10 mL, Refills: 2    lisinopril (PRINIVIL,ZESTRIL) 10 MG tablet Take 1 tablet (10 mg total) by mouth 2 (two) times daily. Qty: 60 tablet, Refills: 0    pravastatin (PRAVACHOL) 40 MG tablet Take 1 tablet (40 mg total) by mouth daily. Qty: 30 tablet, Refills: 0         DISCHARGE INSTRUCTIONS:  Follow-up with Dr. Cherylann Ratel nephrology one week [facility]  If you experience worsening of your admission symptoms, develop shortness of breath, life threatening emergency, suicidal or homicidal thoughts you must seek medical attention immediately by calling 911 or calling your MD immediately  if symptoms less severe.  You Must read complete  instructions/literature along with all the possible adverse reactions/side effects for all the Medicines you take and that have been prescribed to you. Take any new Medicines after you have completely understood and accept all the possible adverse reactions/side effects.   Please note  You were cared for by a hospitalist during your hospital stay. If you have any questions about your discharge medications or the care you received while you were in the hospital after you are discharged, you can call the unit and asked to speak with the hospitalist on call if the hospitalist that took care of you is not available. Once you are discharged, your primary care physician will handle any further medical issues. Please note that NO REFILLS for any discharge medications will be authorized once you are discharged, as it is imperative that you return to your primary care physician (or establish a relationship with a primary care physician if you do not have one) for your aftercare needs so that they can reassess your need for medications and monitor your lab values.    Today   CHIEF COMPLAINT:   Chief Complaint  Patient presents with  . Altered Mental Status    HISTORY OF PRESENT ILLNESS:  Sabrina Davenport  is a 34 y.o. female presented with altered mental status and was found to have accelerated hypertension.   VITAL SIGNS:  Blood pressure 138/84, pulse 72, temperature 98.3 F (36.8 C), temperature source Oral, resp. rate 17, height  (1.727 m), weight 105.461 kg (232 lb 8 oz), SpO2 100 %.   PHYSICAL EXAMINATION:  GENERAL:  34 y.o.-year-old patient lying in the bed with no acute distress.  EYES: Pupils equal, round, reactive to light and accommodation. No scleral icterus. Extraocular muscles intact.  HEENT: Head atraumatic, normocephalic. Oropharynx and nasopharynx clear.  NECK:  Supple, no jugular venous distention. No thyroid enlargement, no tenderness.  LUNGS: Normal breath sounds  bilaterally, no wheezing, rales,rhonchi or crepitation. No use of accessory muscles of respiration.  CARDIOVASCULAR: S1, S2 normal. No murmurs, rubs, or gallops.  ABDOMEN: Soft, non-tender, non-distended. Bowel sounds present. No organomegaly or mass.  EXTREMITIES: 1+ edema, no cyanosis, or clubbing.  NEUROLOGIC: Cranial nerves II through XII are intact. Muscle strength 5/5 in all extremities. Sensation intact. Gait not checked.  PSYCHIATRIC: The patient is alert and oriented x 3.  SKIN: No obvious rash, lesion, or ulcer.   DATA REVIEW:   CBC  Recent Labs Lab 10/19/15 0444  WBC 11.1*  HGB 12.6  HCT 38.2  PLT 261    Chemistries   Recent Labs Lab 10/15/15 0340  10/15/15 2031  10/18/15 0624  NA 140  < > 140  < > 137  K 3.5  < > 3.5  < > 3.4*  CL 109  < > 108  < > 104  CO2 25  < > 24  < > 26  GLUCOSE 115*  < > 100*  < > 103*  BUN 17  < > 18  < > 24*  CREATININE 1.70*  < > 1.78*  < > 1.69*  CALCIUM 8.9  < > 9.2  < > 8.8*  MG  2.1  --   --   --   --   AST 15  < > 21  --   --   ALT 13*  < > 15  --   --   ALKPHOS 68  < > 68  --   --   BILITOT 0.7  < > 0.7  --   --   < > = values in this interval not displayed.  Cardiac Enzymes  Recent Labs Lab 10/14/15 0905  TROPONINI 0.04*    Microbiology Results  Results for orders placed or performed during the hospital encounter of 10/14/15  MRSA PCR Screening     Status: None   Collection Time: 10/15/15  5:50 PM  Result Value Ref Range Status   MRSA by PCR NEGATIVE NEGATIVE Final    Comment:        The GeneXpert MRSA Assay (FDA approved for NASAL specimens only), is one component of a comprehensive MRSA colonization surveillance program. It is not intended to diagnose MRSA infection nor to guide or monitor treatment for MRSA infections.     Management plans discussed with the patient, family and they are in agreement.  CODE STATUS:     Code Status Orders        Start     Ordered   10/14/15 1543  Full code    Continuous     10/14/15 1542      TOTAL TIME TAKING CARE OF THIS PATIENT: 35 minutes.    Alford HighlandWIETING, Sally Reimers M.D on 10/19/2015 at 1:34 PM  Between 7am to 6pm - Pager - [phone]  After 6pm go to [email or web address] - password [email or web address]  Fabio Ne[facility]ors[facility]  Office  [phone]  CC: Primary care physician; [facility] Dr. Mady HaagensenMunsoor Lateef, nephrologist

## 2015-10-19 NOTE — Progress Notes (Signed)
Endocrinology note:  Ms. Dan HumphreysMebane is doing well on the current regimen of insulin levemir 18 units twice daily and novolog 10 units tidcc. Glucose trend reviewed in accordion view. Glycemic control is at goal and she has had no hypoglycemia. She has received appropriate diabetes education including insulin teaching.   Plan:  Continue the current regimen while inpatient. Please keep in mind this will not be affordable for her outpatient, see below. Upon discharge, please prescribe the following regimen:   -16 units regular insulin, inject 15-20 minutes before meals three times daily. Take 1/2 dose if skipping a meal.  -correction regular insulin as follows for pre-meal hyperglycemia: 200-249 2 units; 250-299 4 units; 300-349 6 units; 350-399 8 units; >400 10 units  -16 units NPH insulin at bedtime each night.   -Rx needed for above insulin, glucometer, test strips, lancets. She is scheduled to see me in clinic in follow up.  Will sign off. Please call if any additional questions/concerns. Thank you for allowing me to participate in this patient's care.  Doylene CanningAbby Tolulope Pinkett, MD [facility]

## 2015-10-19 NOTE — Progress Notes (Signed)
Dr. Anne HahnWillis notified of sustained hypertension, post medication of 158/105; Windy Carinaurner,Roylene Heaton K, RN; 12:29 AM; 10/19/2015

## 2015-10-19 NOTE — Plan of Care (Signed)
Problem: Acute Treatment Outcomes Goal: BP within ordered parameters Outcome: Not Progressing BP continues to be elevated; meds adjusted, PRN added.

## 2015-10-19 NOTE — Care Management (Addendum)
Spoke with the patient mother with which whom she resides. Sabrina Davenport(Sabrina Davenport [phone]) Mother stated that she has applications for [facility] and also [facility]. Explained to the mother in detail how to use Match program and application process for [facility].   [facility] and what to bring. Mother expressed understanding of process and expressed appreciation for services. Sabrina GottronJason Davenport at [facility]  Notified of discharge and appointment. [facility]. Cannot start services until patient has first follow up appointment. Mothers contact information given to [place].

## 2015-10-20 ENCOUNTER — Emergency Department: Payer: Self-pay

## 2015-10-20 LAB — ENA+DNA/DS+ANTICH+CENTRO+JO...
Anti JO-1: <0.2 AI (ref 0.0–0.9)
Chromatin Ab SerPl-aCnc: <0.2 AI (ref 0.0–0.9)
DS DNA AB: 1 [IU]/mL (ref 0–9)
ENA SM Ab Ser-aCnc: <0.2 AI (ref 0.0–0.9)
RIBONUCLEIC PROTEIN: 1.4 AI — AB (ref 0.0–0.9)
SSA (Ro) (ENA) Antibody, IgG: <0.2 AI (ref 0.0–0.9)
SSB (La) (ENA) Antibody, IgG: <0.2 AI (ref 0.0–0.9)
Scleroderma (Scl-70) (ENA) Antibody, IgG: <0.2 AI (ref 0.0–0.9)

## 2015-10-20 LAB — COMPREHENSIVE METABOLIC PANEL
ALBUMIN: 4 g/dL (ref 3.5–5.0)
ALK PHOS: 71 U/L (ref 38–126)
ALT: 46 U/L (ref 14–54)
ANION GAP: 8 (ref 5–15)
AST: 45 U/L — AB (ref 15–41)
BUN: 25 mg/dL — ABNORMAL HIGH (ref 6–20)
CALCIUM: 9.4 mg/dL (ref 8.9–10.3)
CO2: 23 mmol/L (ref 22–32)
Chloride: 104 mmol/L (ref 101–111)
Creatinine, Ser: 1.72 mg/dL — ABNORMAL HIGH (ref 0.44–1.00)
GFR, EST AFRICAN AMERICAN: 43 mL/min — AB (ref >60)
GFR, EST NON AFRICAN AMERICAN: 37 mL/min — AB (ref >60)
Glucose, Bld: 106 mg/dL — ABNORMAL HIGH (ref 65–99)
Potassium: 4 mmol/L (ref 3.5–5.1)
SODIUM: 135 mmol/L (ref 135–145)
TOTAL PROTEIN: 8.2 g/dL — AB (ref 6.5–8.1)
Total Bilirubin: 0.5 mg/dL (ref 0.3–1.2)

## 2015-10-20 LAB — CBC
HCT: 40.8 % (ref 35.0–47.0)
HEMOGLOBIN: 13.4 g/dL (ref 12.0–16.0)
MCH: 23.9 pg — ABNORMAL LOW (ref 26.0–34.0)
MCHC: 32.8 g/dL (ref 32.0–36.0)
MCV: 72.9 fL — ABNORMAL LOW (ref 80.0–100.0)
Platelets: 328 10*3/uL (ref 150–440)
RBC: 5.59 MIL/uL — ABNORMAL HIGH (ref 3.80–5.20)
RDW: 13.7 % (ref 11.5–14.5)
WBC: 12.7 10*3/uL — AB (ref 3.6–11.0)

## 2015-10-20 LAB — ANA W/REFLEX IF POSITIVE: ANA: POSITIVE — AB

## 2015-10-20 MED ORDER — CLONIDINE HCL 0.1 MG PO TABS
ORAL_TABLET | ORAL | Status: AC
  Filled 2015-10-20: qty 1

## 2015-10-20 MED ORDER — CLONIDINE HCL 0.1 MG PO TABS
0.1000 mg | ORAL_TABLET | Freq: Once | ORAL | Status: AC
  Administered 2015-10-20: 0.1 mg via ORAL

## 2015-10-20 MED ORDER — CLONIDINE HCL 0.1 MG PO TABS
0.1000 mg | ORAL_TABLET | Freq: Once | ORAL | Status: AC
  Administered 2015-10-20: 0.1 mg via ORAL
  Filled 2015-10-20: qty 1

## 2015-10-20 MED ORDER — GADOBENATE DIMEGLUMINE 529 MG/ML IV SOLN
10.0000 mL | Freq: Once | INTRAVENOUS | Status: AC | PRN
  Administered 2015-10-20: 10 mL via INTRAVENOUS

## 2015-10-20 MED ORDER — LORAZEPAM 2 MG/ML IJ SOLN
2.0000 mg | Freq: Once | INTRAMUSCULAR | Status: AC
  Administered 2015-10-20: 2 mg via INTRAVENOUS

## 2015-10-20 MED ORDER — LORAZEPAM 2 MG/ML IJ SOLN
INTRAMUSCULAR | Status: AC
  Administered 2015-10-20: 2 mg via INTRAVENOUS
  Filled 2015-10-20: qty 1

## 2015-10-20 NOTE — ED Notes (Signed)
Patient transported to CT 

## 2015-10-20 NOTE — ED Notes (Signed)
Patient transported to MRI with er tech.  Pt drowsy.

## 2015-10-20 NOTE — ED Notes (Signed)
Pt and pt's aunt verbalize understanding of discharge instructions

## 2015-10-20 NOTE — ED Notes (Signed)
Pt at MRI

## 2015-10-20 NOTE — ED Notes (Signed)
MD at bedside. 

## 2015-10-20 NOTE — ED Notes (Signed)
[facility] guide given to family.

## 2015-10-20 NOTE — Discharge Instructions (Signed)
Hypertension  Hypertension, commonly called high blood pressure, is when the force of blood pumping through your arteries is too strong. Your arteries are the blood vessels that carry blood from your heart throughout your body. A blood pressure reading consists of a higher number over a lower number, such as 110/72. The higher number (systolic) is the pressure inside your arteries when your heart pumps. The lower number (diastolic) is the pressure inside your arteries when your heart relaxes. Ideally you want your blood pressure below 120/80.  Hypertension forces your heart to work harder to pump blood. Your arteries may become narrow or stiff. Having untreated or uncontrolled hypertension can cause heart attack, stroke, kidney disease, and other problems.  RISK FACTORS  Some risk factors for high blood pressure are controllable. Others are not.   Risk factors you cannot control include:   · Race. You may be at higher risk if you are African American.  · Age. Risk increases with age.  · Gender. Men are at higher risk than women before age 45 years. After age 65, women are at higher risk than men.  Risk factors you can control include:  · Not getting enough exercise or physical activity.  · Being overweight.  · Getting too much fat, sugar, calories, or salt in your diet.  · Drinking too much alcohol.  SIGNS AND SYMPTOMS  Hypertension does not usually cause signs or symptoms. Extremely high blood pressure (hypertensive crisis) may cause headache, anxiety, shortness of breath, and nosebleed.  DIAGNOSIS  To check if you have hypertension, your health care provider will measure your blood pressure while you are seated, with your arm held at the level of your heart. It should be measured at least twice using the same arm. Certain conditions can cause a difference in blood pressure between your right and left arms. A blood pressure reading that is higher than normal on one occasion does not mean that you need treatment. If  it is not clear whether you have high blood pressure, you may be asked to return on a different day to have your blood pressure checked again. Or, you may be asked to monitor your blood pressure at home for 1 or more weeks.  TREATMENT  Treating high blood pressure includes making lifestyle changes and possibly taking medicine. Living a healthy lifestyle can help lower high blood pressure. You may need to change some of your habits.  Lifestyle changes may include:  · Following the DASH diet. This diet is high in fruits, vegetables, and whole grains. It is low in salt, red meat, and added sugars.  · Keep your sodium intake below 2,300 mg per day.  · Getting at least 30-45 minutes of aerobic exercise at least 4 times per week.  · Losing weight if necessary.  · Not smoking.  · Limiting alcoholic beverages.  · Learning ways to reduce stress.  Your health care provider may prescribe medicine if lifestyle changes are not enough to get your blood pressure under control, and if one of the following is true:  · You are 18-59 years of age and your systolic blood pressure is above 140.  · You are 60 years of age or older, and your systolic blood pressure is above 150.  · Your diastolic blood pressure is above 90.  · You have diabetes, and your systolic blood pressure is over 140 or your diastolic blood pressure is over 90.  · You have kidney disease and your blood pressure is above   140/90.  · You have heart disease and your blood pressure is above 140/90.  Your personal target blood pressure may vary depending on your medical conditions, your age, and other factors.  HOME CARE INSTRUCTIONS  · Have your blood pressure rechecked as directed by your health care provider.    · Take medicines only as directed by your health care provider. Follow the directions carefully. Blood pressure medicines must be taken as prescribed. The medicine does not work as well when you skip doses. Skipping doses also puts you at risk for  problems.  · Do not smoke.    · Monitor your blood pressure at home as directed by your health care provider.   SEEK MEDICAL CARE IF:   · You think you are having a reaction to medicines taken.  · You have recurrent headaches or feel dizzy.  · You have swelling in your ankles.  · You have trouble with your vision.  SEEK IMMEDIATE MEDICAL CARE IF:  · You develop a severe headache or confusion.  · You have unusual weakness, numbness, or feel faint.  · You have severe chest or abdominal pain.  · You vomit repeatedly.  · You have trouble breathing.  MAKE SURE YOU:   · Understand these instructions.  · Will watch your condition.  · Will get help right away if you are not doing well or get worse.     This information is not intended to replace advice given to you by your health care provider. Make sure you discuss any questions you have with your health care provider.     Document Released: 12/04/2005 Document Revised: 04/20/2015 Document Reviewed: 09/26/2013  Elsevier Interactive Patient Education ©2016 Elsevier Inc.    Panic Attacks  Panic attacks are sudden, short-lived surges of severe anxiety, fear, or discomfort. They may occur for no reason when you are relaxed, when you are anxious, or when you are sleeping. Panic attacks may occur for a number of reasons:   · Healthy people occasionally have panic attacks in extreme, life-threatening situations, such as war or natural disasters. Normal anxiety is a protective mechanism of the body that helps us react to danger (fight or flight response).  · Panic attacks are often seen with anxiety disorders, such as panic disorder, social anxiety disorder, generalized anxiety disorder, and phobias. Anxiety disorders cause excessive or uncontrollable anxiety. They may interfere with your relationships or other life activities.  · Panic attacks are sometimes seen with other mental illnesses, such as depression and posttraumatic stress disorder.  · Certain medical conditions,  prescription medicines, and drugs of abuse can cause panic attacks.  SYMPTOMS   Panic attacks start suddenly, peak within 20 minutes, and are accompanied by four or more of the following symptoms:  · Pounding heart or fast heart rate (palpitations).  · Sweating.  · Trembling or shaking.  · Shortness of breath or feeling smothered.  · Feeling choked.  · Chest pain or discomfort.  · Nausea or strange feeling in your stomach.  · Dizziness, light-headedness, or feeling like you will faint.  · Chills or hot flushes.  · Numbness or tingling in your lips or hands and feet.  · Feeling that things are not real or feeling that you are not yourself.  · Fear of losing control or going crazy.  · Fear of dying.  Some of these symptoms can mimic serious medical conditions. For example, you may think you are having a heart attack. Although panic attacks can be very scary, they   are not life threatening.  DIAGNOSIS   Panic attacks are diagnosed through an assessment by your health care provider. Your health care provider will ask questions about your symptoms, such as where and when they occurred. Your health care provider will also ask about your medical history and use of alcohol and drugs, including prescription medicines. Your health care provider may order blood tests or other studies to rule out a serious medical condition. Your health care provider may refer you to a mental health professional for further evaluation.  TREATMENT   · Most healthy people who have one or two panic attacks in an extreme, life-threatening situation will not require treatment.  · The treatment for panic attacks associated with anxiety disorders or other mental illness typically involves counseling with a mental health professional, medicine, or a combination of both. Your health care provider will help determine what treatment is best for you.  · Panic attacks due to physical illness usually go away with treatment of the illness. If prescription  medicine is causing panic attacks, talk with your health care provider about stopping the medicine, decreasing the dose, or substituting another medicine.  · Panic attacks due to alcohol or drug abuse go away with abstinence. Some adults need professional help in order to stop drinking or using drugs.  HOME CARE INSTRUCTIONS   · Take all medicines as directed by your health care provider.    · Schedule and attend follow-up visits as directed by your health care provider. It is important to keep all your appointments.  SEEK MEDICAL CARE IF:  · You are not able to take your medicines as prescribed.  · Your symptoms do not improve or get worse.  SEEK IMMEDIATE MEDICAL CARE IF:   · You experience panic attack symptoms that are different than your usual symptoms.  · You have serious thoughts about hurting yourself or others.  · You are taking medicine for panic attacks and have a serious side effect.  MAKE SURE YOU:  · Understand these instructions.  · Will watch your condition.  · Will get help right away if you are not doing well or get worse.     This information is not intended to replace advice given to you by your health care provider. Make sure you discuss any questions you have with your health care provider.     Document Released: 12/04/2005 Document Revised: 12/09/2013 Document Reviewed: 07/18/2013  Elsevier Interactive Patient Education ©2016 Elsevier Inc.

## 2015-10-20 NOTE — ED Notes (Signed)
Family at bedside. 

## 2015-10-20 NOTE — ED Notes (Addendum)
Pt A&O very nervous and tearful upon entering room.Pt complains of bilateral hand numbness. Family brings pt in for high blood pressure and alter LOC. Pt was discharged around 4pm today from hospital for DKA and stroke. Pt denies any pain.

## 2015-10-20 NOTE — ED Provider Notes (Signed)
[facility] Provider Note  ____________________________________________  Time seen: 12:30 AM  I have reviewed the triage vital signs and the nursing notes.   HISTORY  Chief Complaint Hypertension     HPI Sabrina FurbishJennifer R Davenport is a 34 y.o. female presents with "feeling anxious nervous before going to bed. Patient stated that she started having bilateral arm numbness and tingling as well as perioral. Patient's onset bedside states that she's been confused and appears to have altered mental status from her baseline. In addition the patient's family states that her blood pressures markedly elevated. Of note patient was discharged from hospital yesterday at 4 PM secondary to hyperglycemia and hypertension CT scan during her hospital stay revealed a small area of this acute infarct in the brain. Patient states that an MRI was not performed because she refused to have it done secondary to claustrophobia/anxiety    Past Medical History  Diagnosis Date  . Hypertension     Patient Active Problem List   Diagnosis Date Noted  . Encephalopathy acute 10/14/2015    History reviewed. No pertinent past surgical history.  Current Outpatient Rx  Name  Route  Sig  Dispense  Refill  . amLODipine (NORVASC) 10 MG tablet   Oral   Take 1 tablet (10 mg total) by mouth daily.   30 tablet   0   . aspirin 81 MG chewable tablet   Oral   Chew 1 tablet (81 mg total) by mouth daily.   30 tablet   0   . carvedilol (COREG) 12.5 MG tablet   Oral   Take 1 tablet (12.5 mg total) by mouth 2 (two) times daily with a meal.   60 tablet   0   . cloNIDine (CATAPRES) 0.2 MG tablet   Oral   Take 1 tablet (0.2 mg total) by mouth 2 (two) times daily.   60 tablet   0   . insulin NPH Human (HUMULIN N,NOVOLIN N) 100 UNIT/ML injection   Subcutaneous   Inject 0.18 mLs (18 Units total) into the skin 2 (two) times daily at 8 am and 10 pm.   10 mL   2   . insulin  regular (NOVOLIN R,HUMULIN R) 100 units/mL injection   Subcutaneous   Inject 0.1 mLs (10 Units total) into the skin 3 (three) times daily before meals.   10 mL   2   . lisinopril (PRINIVIL,ZESTRIL) 10 MG tablet   Oral   Take 1 tablet (10 mg total) by mouth 2 (two) times daily.   60 tablet   0   . pravastatin (PRAVACHOL) 40 MG tablet   Oral   Take 1 tablet (40 mg total) by mouth daily.   30 tablet   0     Allergies Review of patient's allergies indicates no known allergies.  Family History  Problem Relation Age of Onset  . Hypertension Mother     Social History Social History  Substance Use Topics  . Smoking status: Current Every Day Smoker -- 0.50 packs/day for 10 years    Types: Cigarettes  . Smokeless tobacco: None  . Alcohol Use: No    Review of Systems  Constitutional: Negative for fever. Eyes: Negative for visual changes. ENT: Negative for sore throat. Cardiovascular: Negative for chest pain. Respiratory: Negative for shortness of breath. Gastrointestinal: Negative for abdominal pain, vomiting and diarrhea. Genitourinary: Negative for dysuria. Musculoskeletal: Negative for back pain. Skin: Negative for rash. Neurological: Negative for headaches, focal weakness or  numbness. Psychiatric: Positive for anxiety  10-point ROS otherwise negative.  ____________________________________________   PHYSICAL EXAM:  VITAL SIGNS: ED Triage Vitals  Enc Vitals Group     BP 10/19/15 2349 163/124 mmHg     Pulse Rate 10/19/15 2349 92     Resp 10/19/15 2349 18     Temp 10/19/15 2349 98.7 F (37.1 C)     Temp Source 10/19/15 2349 Oral     SpO2 10/19/15 2349 95 %     Weight 10/19/15 2349 230 lb (104.327 kg)     Height --      Head Cir --      Peak Flow --      Pain Score 10/20/15 0004 0     Pain Loc --      Pain Edu? --      Excl. in GC? --      Constitutional: Alert and oriented. Well appearing and in no distress. Asleep but easily arousable Eyes:  Conjunctivae are normal. PERRL. Normal extraocular movements. ENT   Head: Normocephalic and atraumatic.   Nose: No congestion/rhinnorhea.   Mouth/Throat: Mucous membranes are moist.   Neck: No stridor. Hematological/Lymphatic/Immunilogical: No cervical lymphadenopathy. Cardiovascular: Normal rate, regular rhythm. Normal and symmetric distal pulses are present in all extremities. No murmurs, rubs, or gallops. Respiratory: Normal respiratory effort without tachypnea nor retractions. Breath sounds are clear and equal bilaterally. No wheezes/rales/rhonchi. Gastrointestinal: Soft and nontender. No distention. There is no CVA tenderness. Genitourinary: deferred Musculoskeletal: Nontender with normal range of motion in all extremities. No joint effusions.  No lower extremity tenderness nor edema. Neurologic:  Normal speech and language. No gross focal neurologic deficits are appreciated. Speech is normal.  Skin:  Skin is warm, dry and intact. No rash noted. Psychiatric: Mood and affect are normal. Speech and behavior are normal. Patient exhibits appropriate insight and judgment.  ____________________________________________    LABS (pertinent positives/negatives)  Labs Reviewed  CBC - Abnormal; Notable for the following:    WBC 12.7 (*)    RBC 5.59 (*)    MCV 72.9 (*)    MCH 23.9 (*)    All other components within normal limits  COMPREHENSIVE METABOLIC PANEL - Abnormal; Notable for the following:    Glucose, Bld 106 (*)    BUN 25 (*)    Creatinine, Ser 1.72 (*)    Total Protein 8.2 (*)    AST 45 (*)    GFR calc non Af Amer 37 (*)    GFR calc Af Amer 43 (*)    All other components within normal limits        RADIOLOGY  MR Brain W Wo Contrast (Final result) Result time: 10/20/15 03:44:39   Procedure changed from MR Angiogram Head W Wo Contrast      Final result by Rad Results In Interface (10/20/15 03:44:39)   Narrative:   CLINICAL DATA: Confusion,  hypertension. Discharged from hospital today with DKA and stroke.  EXAM: MRI HEAD WITHOUT AND WITH CONTRAST  TECHNIQUE: Multiplanar, multiecho pulse sequences of the brain and surrounding structures were obtained without and with intravenous contrast.  CONTRAST: 10mL MULTIHANCE GADOBENATE DIMEGLUMINE 529 MG/ML IV SOLN  COMPARISON: CT head October 20, 2015 at 1:04 a.m.  FINDINGS: Mild motion degraded examination.  The ventricles and sulci are normal for patient's age. No abnormal parenchymal signal, mass lesions, mass effect. No abnormal parenchymal enhancement. No reduced diffusion to suggest acute ischemia nor hyperacute demyelination. No susceptibility artifact to suggest hemorrhage. Old RIGHT basal ganglia  lacunar infarct. Patchy LEFT pontine T2 hyperintense signal. Scattered subcentimeter supratentorial white matter T2 hyperintensities, in a general disturbed fusion.  No abnormal extra-axial fluid collections. No extra-axial masses nor leptomeningeal enhancement. Normal major intracranial vascular flow voids seen at the skull base.  Ocular globes and orbital contents are unremarkable though not tailored for evaluation. No suspicious calvarial bone marrow signal. Mild scratch sellar expansion, pituitary tissue along the floor. Craniocervical junction maintained. Visualized paranasal sinuses and mastoid air cells are well-aerated.  IMPRESSION: Mildly motion degraded examination without acute intracranial process.  Moderate white matter changes, advanced for age in a distribution suggesting chronic small vessel ischemic disease, there could be a superimposed component of demyelination or vasculopathy though, less favored.  Old RIGHT basal ganglia lacunar infarct.  Partially empty sella.   Electronically Signed By: Awilda Metro M.D. On: 10/20/2015 03:44          CT Head Wo Contrast (Final result) Result time: 10/20/15 01:59:56   Final result by  Rad Results In Interface (10/20/15 01:59:56)   Narrative:   CLINICAL DATA: Bilateral hand numbness. High blood pressure. Altered mental status.  EXAM: CT HEAD WITHOUT CONTRAST  TECHNIQUE: Contiguous axial images were obtained from the base of the skull through the vertex without intravenous contrast.  COMPARISON: 10/14/2015  FINDINGS: Ventricles and sulci appear symmetrical. Patchy low-attenuation in the right anterior periventricular white matter and external capsule. This was present previously and may represent old lacune or infarct or other white matter disease. If symptoms are suggestive of multiple sclerosis, consider MRI for further evaluation. No mass effect or midline shift. No abnormal extra-axial fluid collections. Gray-white matter junctions are distinct. Basal cisterns are not effaced. No evidence of acute intracranial hemorrhage. No depressed skull fractures. Visualized paranasal sinuses and mastoid air cells are not opacified.  IMPRESSION: No acute intracranial abnormalities. White matter changes again demonstrated in the right periventricular region and external capsule regions similar to prior study. Consider ischemia or a demyelinating process.   Electronically Signed By: Burman Nieves M.D. On: 10/20/2015 01:59           INITIAL IMPRESSION / ASSESSMENT AND PLAN / ED COURSE  Pertinent labs & imaging results that were available during my care of the patient were reviewed by me and considered in my medical decision making (see chart for details).  Considered extension the patient's initial stroke for which she was evaluated during her previous admission as etiology for the patient's altered level of consciousness. Given that the patient did not have an MRI performed at that time MRI was performed today with the above findings. In addition patient noted to be hypertensive on presentation emergency department received a total of clonidine 0.2  mg with improvement in blood pressure. Patient no longer anxious resting comfortably. I instructed the patient and her family [facility] as I was informed that the patient did not have any health insurance area and I stressed the importance of the patient following [facility] for appropriate diabetic and hypertension management.  ____________________________________________   FINAL CLINICAL IMPRESSION(S) / ED DIAGNOSES  Final diagnoses:  Essential hypertension  Anxiety      Darci Current, MD 10/20/15 [phone]

## 2015-10-20 NOTE — Progress Notes (Signed)
Unable to approve for charity care due to patient never being able to produce household income. Multiple attempts and patient never seemed concerned about getting the information. Tried to contact mother at home number but phone just rang without voicemail. Patient stated there were 3 family members total in the home but was unsure of the household income as both parents worked. Spoke with another female in hospital room 11/1 as well and she was unsure of household income as well. Patient was told on 11/1 to inform Raiford NobleRick, CM when mother came to pick her up to inform him of household income but failed to do so at that time as well. Episode closed.

## 2016-04-11 ENCOUNTER — Other Ambulatory Visit
Admission: RE | Admit: 2016-04-11 | Discharge: 2016-04-11 | Disposition: A | Discharge status: Home / Self Care | Payer: Self-pay | Source: Ambulatory Visit | Attending: Nephrology | Admitting: Nephrology

## 2016-04-11 DIAGNOSIS — N183 Chronic kidney disease, stage 3 (moderate): Secondary | ICD-10-CM | POA: Insufficient documentation

## 2016-04-11 DIAGNOSIS — I10 Essential (primary) hypertension: Secondary | ICD-10-CM | POA: Insufficient documentation

## 2016-04-11 DIAGNOSIS — E1121 Type 2 diabetes mellitus with diabetic nephropathy: Secondary | ICD-10-CM | POA: Insufficient documentation

## 2016-04-11 LAB — COMPREHENSIVE METABOLIC PANEL
ALBUMIN: 4.1 g/dL (ref 3.5–5.0)
ALK PHOS: 45 U/L (ref 38–126)
ALT: 13 U/L — ABNORMAL LOW (ref 14–54)
AST: 15 U/L (ref 15–41)
Anion gap: 6 (ref 5–15)
BILIRUBIN TOTAL: 0.5 mg/dL (ref 0.3–1.2)
BUN: 23 mg/dL — AB (ref 6–20)
CALCIUM: 9.3 mg/dL (ref 8.9–10.3)
CO2: 25 mmol/L (ref 22–32)
Chloride: 104 mmol/L (ref 101–111)
Creatinine, Ser: 1.48 mg/dL — ABNORMAL HIGH (ref 0.44–1.00)
GFR calc Af Amer: 52 mL/min — ABNORMAL LOW (ref >60)
GFR calc non Af Amer: 45 mL/min — ABNORMAL LOW (ref >60)
GLUCOSE: 89 mg/dL (ref 65–99)
Potassium: 4.4 mmol/L (ref 3.5–5.1)
SODIUM: 135 mmol/L (ref 135–145)
TOTAL PROTEIN: 8 g/dL (ref 6.5–8.1)

## 2016-04-12 LAB — MICROALBUMIN / CREATININE URINE RATIO
CREATININE, UR: 363.8 mg/dL
MICROALB UR: 143.7 ug/mL — AB
MICROALB/CREAT RATIO: 39.5 mg/g{creat} — AB (ref 0.0–30.0)

## 2017-01-11 IMAGING — CT CT HEAD W/O CM
3 series · 16 of 30 positions shown, 17 images · non-contrast
Comparison: None.

CLINICAL DATA: confusion/altered mental status

EXAM:
CT HEAD WITHOUT CONTRAST
TECHNIQUE: Contiguous axial images were obtained from the base of the skull
through the vertex without intravenous contrast.

[Series 2: head bone · axial · 0.43mm/px · z∈[-168,-28]mm · 8 of 85 slices shown]
[im 10/85  bone]
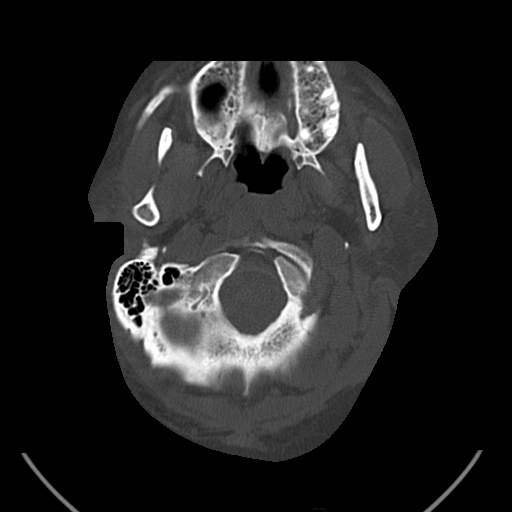
[im 20/85  bone]
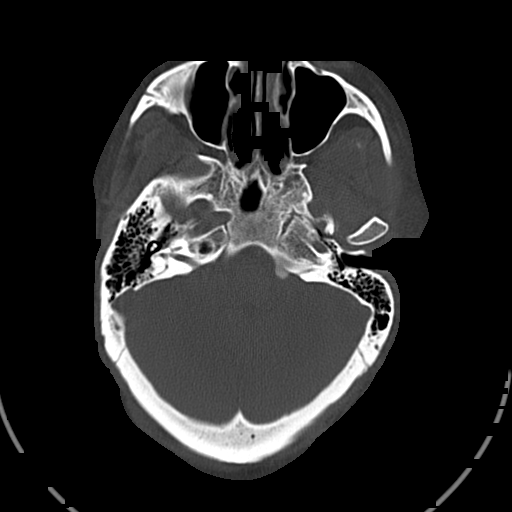
[im 30/85  bone]
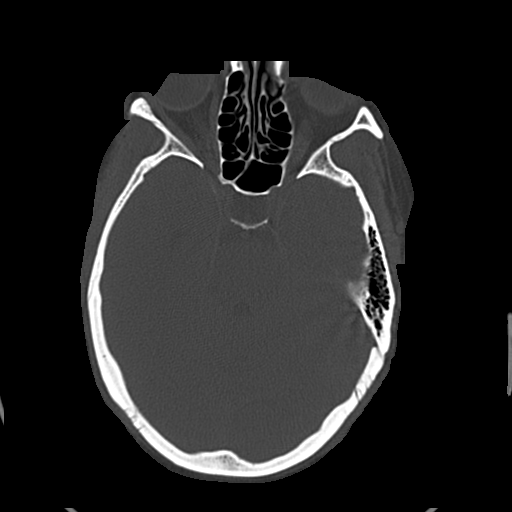
[im 40/85  bone]
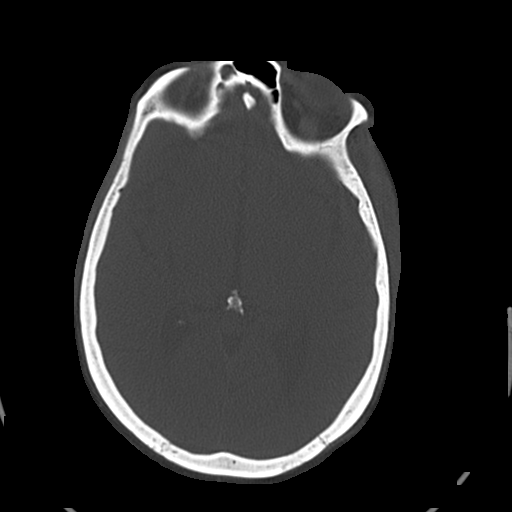
[im 50/85  bone]
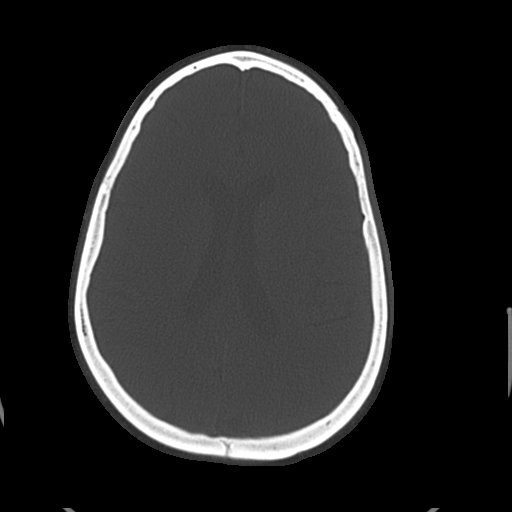
[im 60/85  bone]
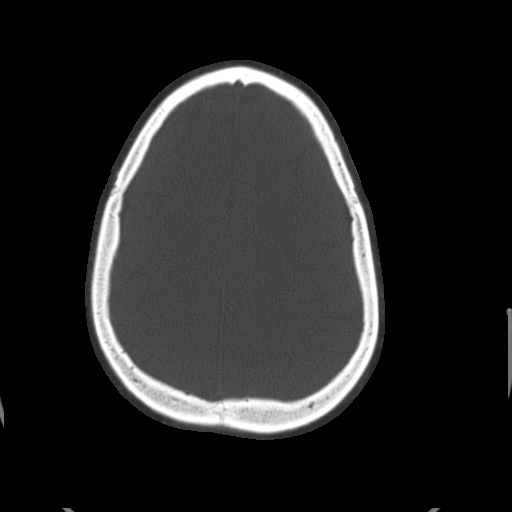
[im 70/85  bone]
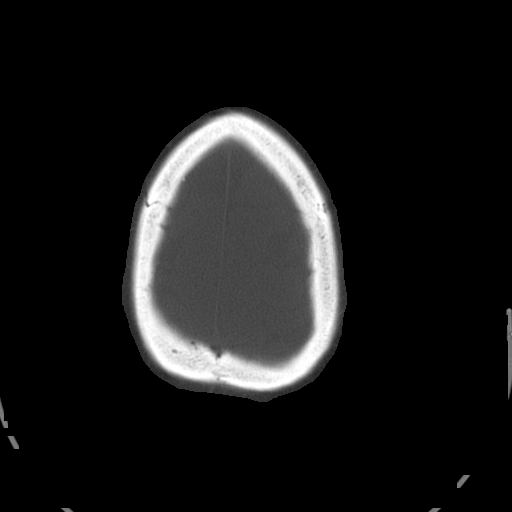
[im 80/85  bone]
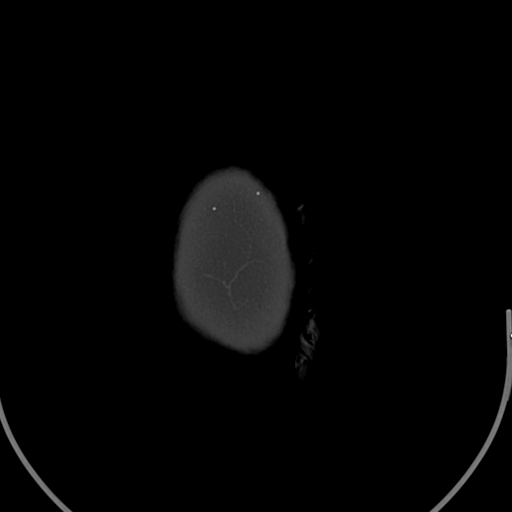

[Series 3: head wo · axial · 0.43mm/px · z∈[-146,-56]mm · 4 of 31 slices shown, 5 images]
[im 7/31  brain]
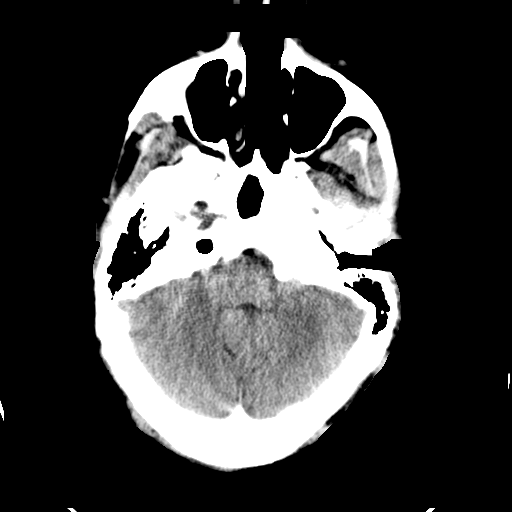
[im 7/31  bone]
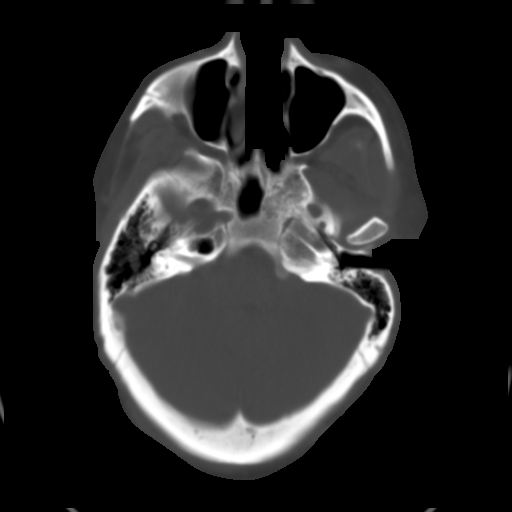
[im 13/31  brain]
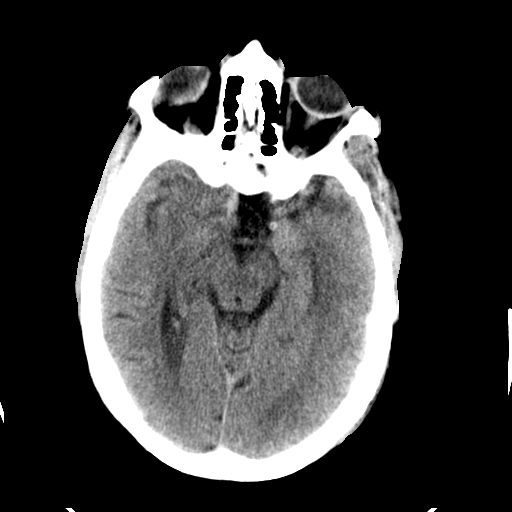
[im 19/31  brain]
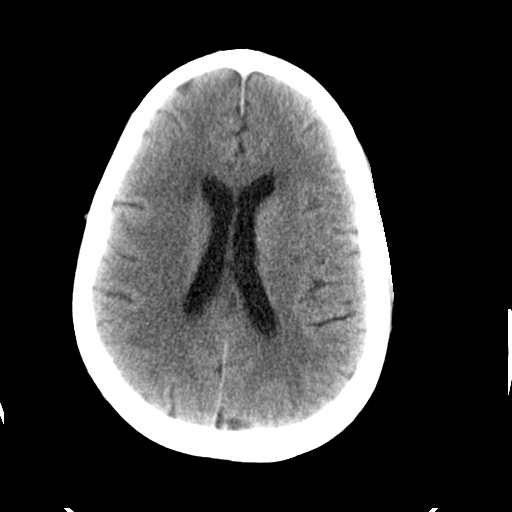
[im 25/31  brain]
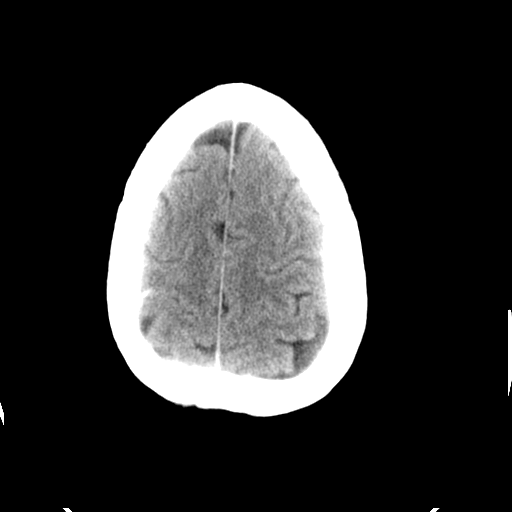

[Series 4: head wo recon · axial · 0.36mm/px · z∈[-121,-36]mm · 4 of 32 slices shown]
[im 7/32  brain]
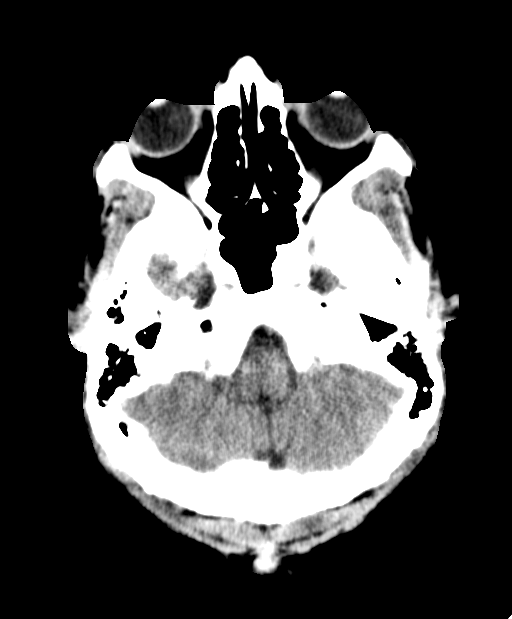
[im 13/32  brain]
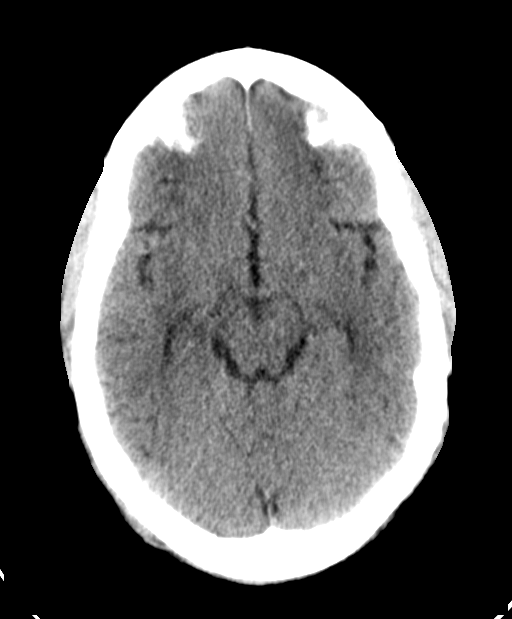
[im 19/32  brain]
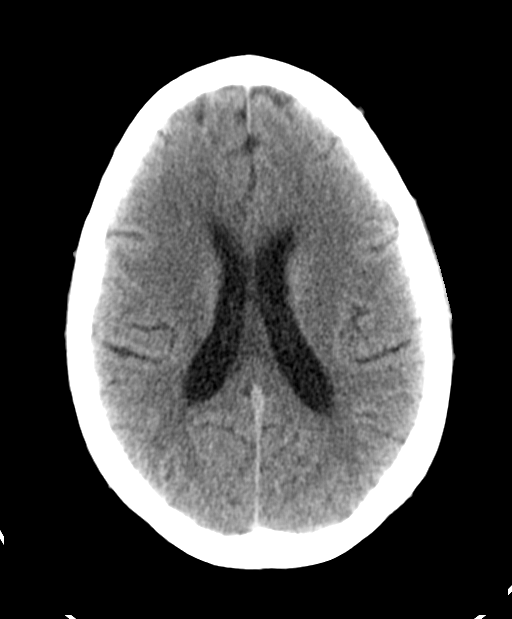
[im 25/32  brain]
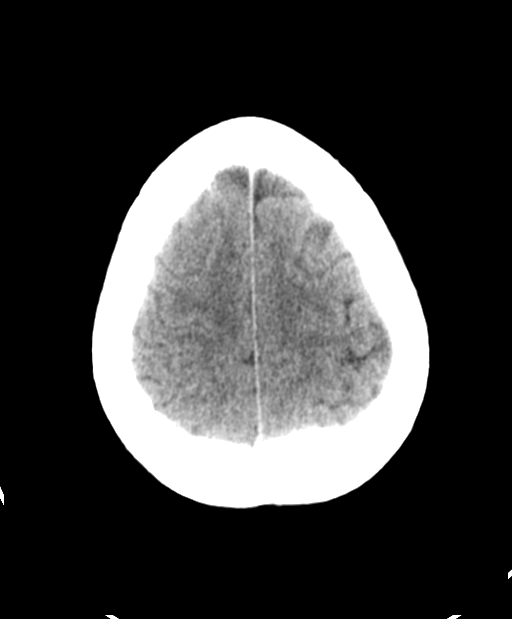

[16 of 30 positions shown; findings below may reference images not displayed]

FINDINGS: The ventricles are normal in size and configuration. Cranial mass,
hemorrhage, extra-axial fluid collection, or midline shift. There is
a small focus of decreased attenuation in the anterior limb of the
right external capsule consistent with a small recent infarct.
Elsewhere gray-white compartments are normal. Bony calvarium appears
intact. The mastoid air cells are clear.
IMPRESSION: Small probable acute infarct in the anterior limb of the right
external capsule. Gray-white compartments elsewhere appear normal.
No hemorrhage or mass effect.

## 2017-02-18 IMAGING — US US RENAL
1 series · 14 of 25 positions shown · non-contrast
Comparison: None.

CLINICAL DATA: Acute renal failure

EXAM:
RENAL / URINARY TRACT ULTRASOUND COMPLETE

[Series 1: us renal · 0.25mm/px · 14 of 38 slices shown]
[im 1/38]
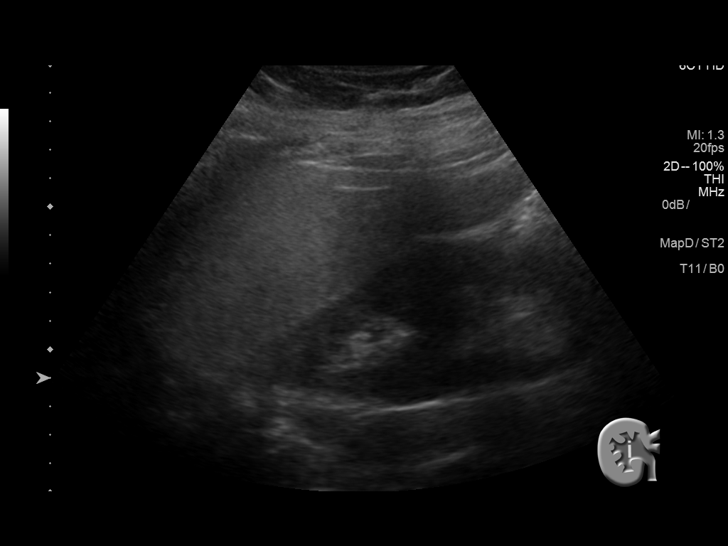
[im 4/38]
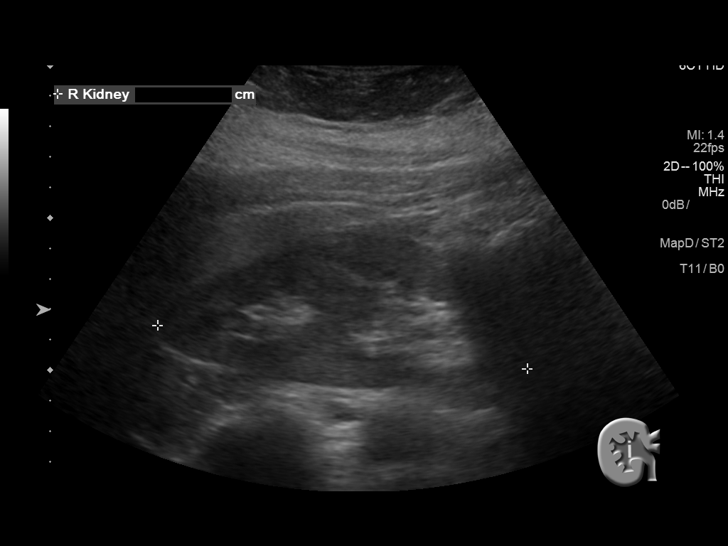
[im 7/38]
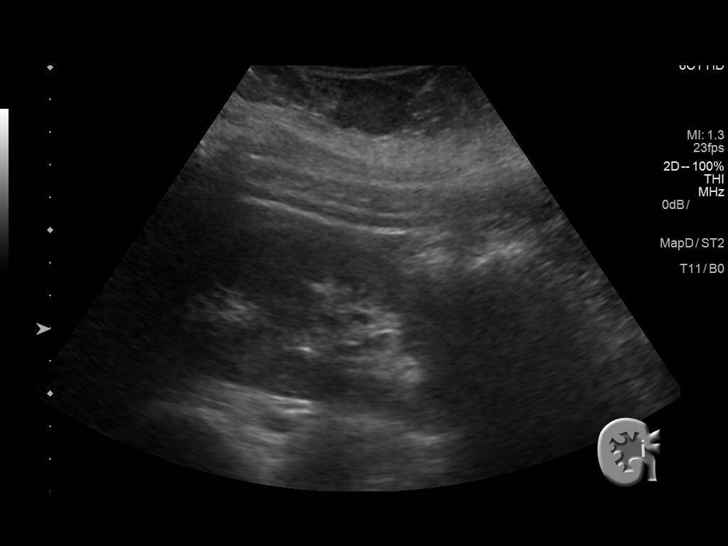
[im 10/38]
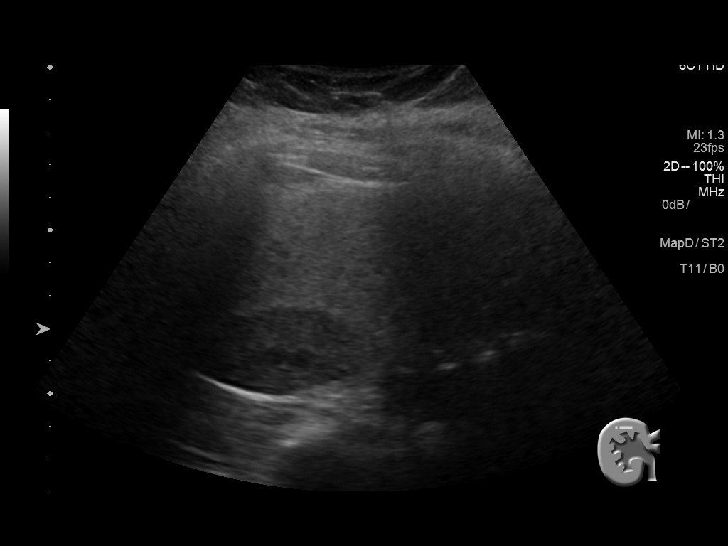
[im 13/38]
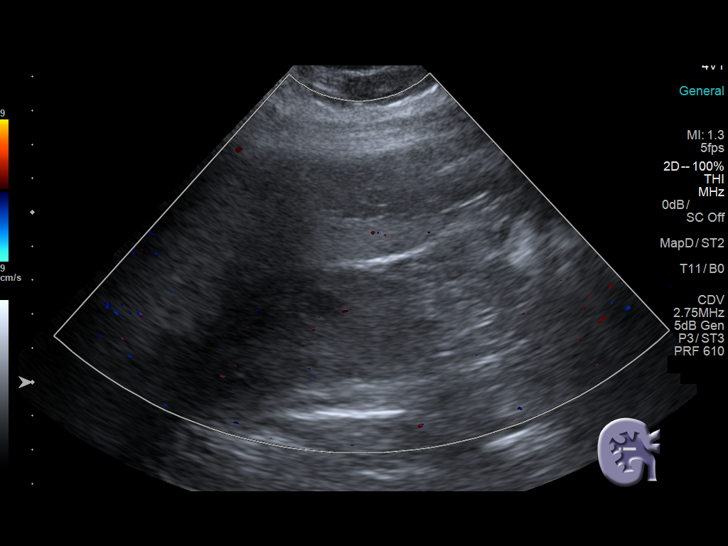
[im 14/38]
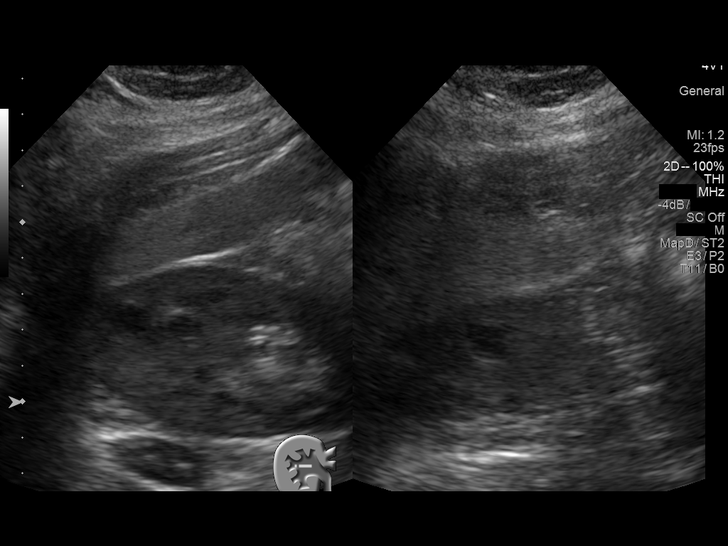
[im 17/38]
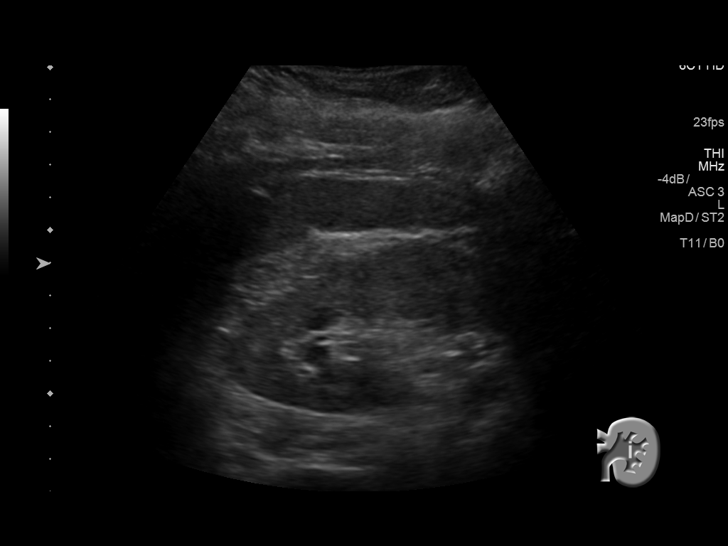
[im 21/38]
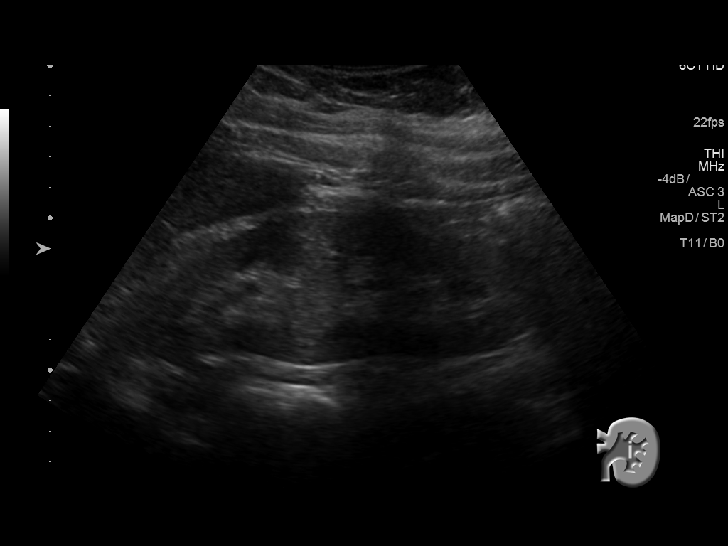
[im 24/38]
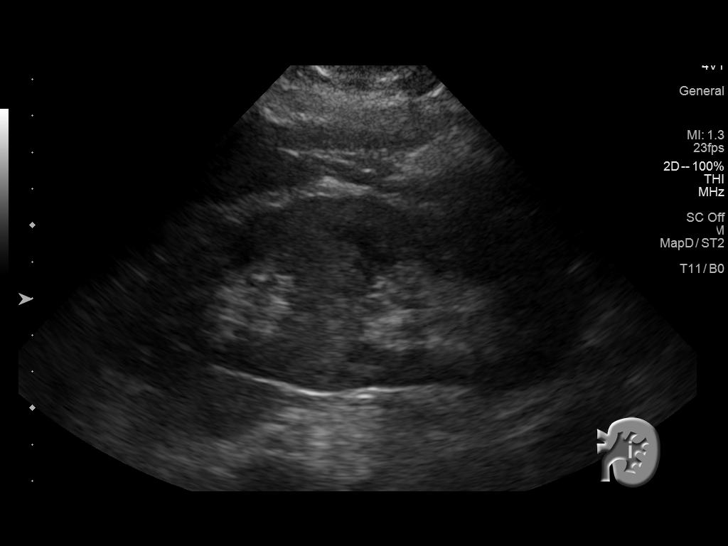
[im 25/38]
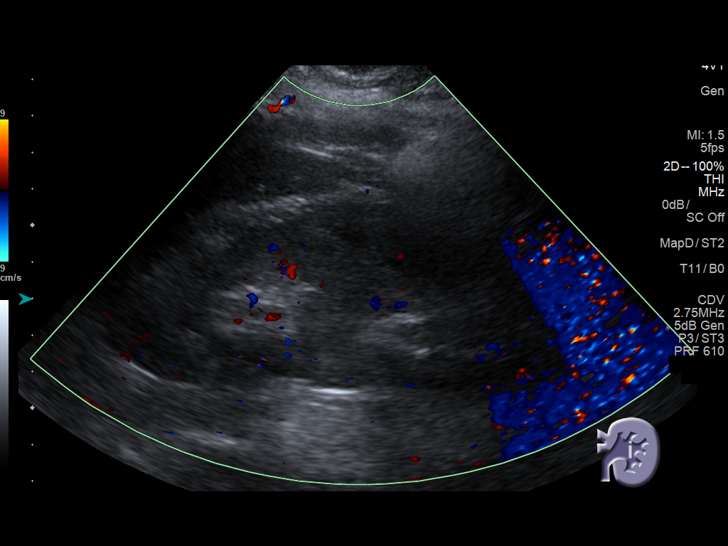
[im 28/38]
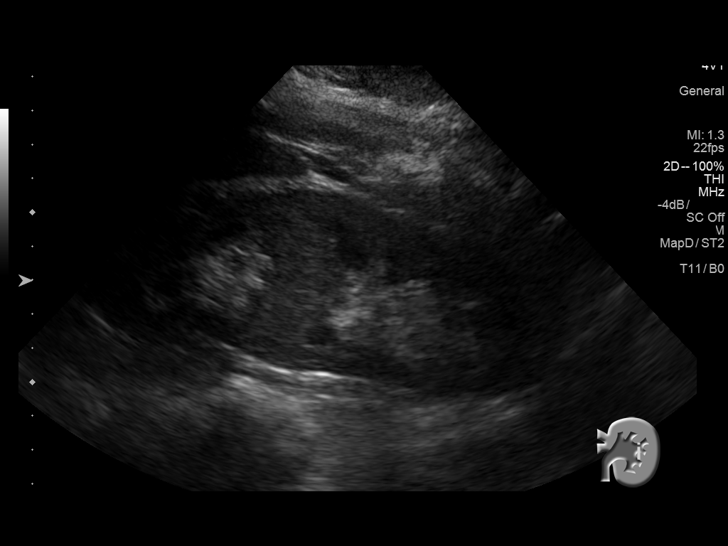
[im 31/38]
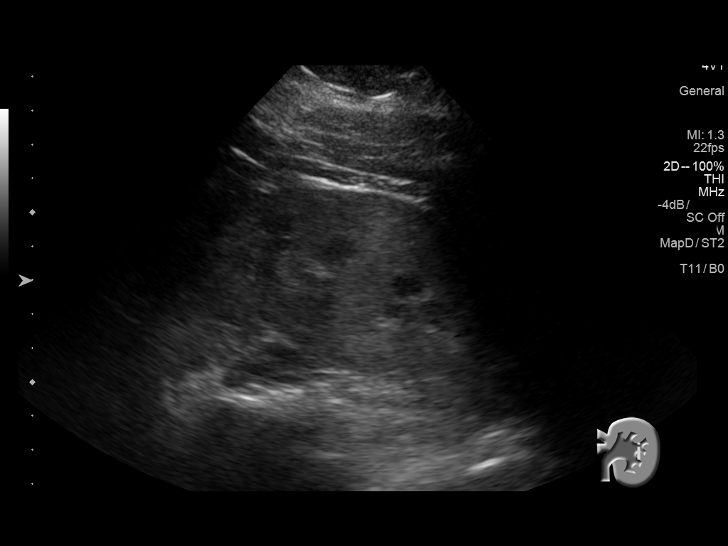
[im 34/38]
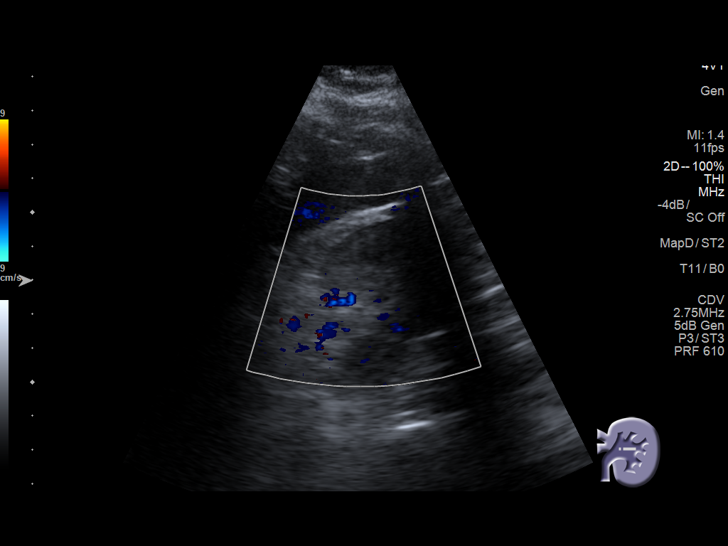
[im 38/38]
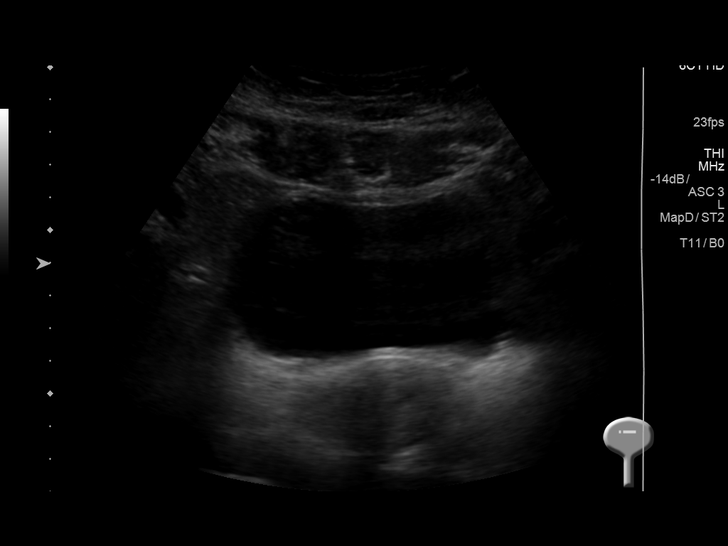

[14 of 25 positions shown; findings below may reference images not displayed]

FINDINGS: Right Kidney:

Length: 12.2 cm. Echogenicity within normal limits. No
hydronephrosis. A midpole cyst measures 1.2 x 1.1 cm

Left Kidney:

Length: 30 in cm in length. Echogenicity within normal limits. No
mass or hydronephrosis visualized.

Bladder:

Appears normal for degree of bladder distention.
IMPRESSION: No hydronephrosis. No nephrolithiasis. Cyst in midpole of the right
kidney measures 1.2 cm.

## 2017-08-01 ENCOUNTER — Other Ambulatory Visit
Admission: RE | Admit: 2017-08-01 | Discharge: 2017-08-01 | Disposition: A | Discharge status: Home / Self Care | Payer: Self-pay | Source: Ambulatory Visit | Attending: Nephrology | Admitting: Nephrology

## 2017-08-01 DIAGNOSIS — N182 Chronic kidney disease, stage 2 (mild): Secondary | ICD-10-CM | POA: Insufficient documentation

## 2017-08-01 LAB — COMPREHENSIVE METABOLIC PANEL
ALT: 25 U/L (ref 14–54)
ANION GAP: 7 (ref 5–15)
AST: 23 U/L (ref 15–41)
Albumin: 4.2 g/dL (ref 3.5–5.0)
Alkaline Phosphatase: 45 U/L (ref 38–126)
BILIRUBIN TOTAL: 0.5 mg/dL (ref 0.3–1.2)
BUN: 21 mg/dL — ABNORMAL HIGH (ref 6–20)
CHLORIDE: 105 mmol/L (ref 101–111)
CO2: 24 mmol/L (ref 22–32)
Calcium: 9.2 mg/dL (ref 8.9–10.3)
Creatinine, Ser: 1.26 mg/dL — ABNORMAL HIGH (ref 0.44–1.00)
GFR, EST NON AFRICAN AMERICAN: 54 mL/min — AB (ref >60)
Glucose, Bld: 194 mg/dL — ABNORMAL HIGH (ref 65–99)
POTASSIUM: 4.1 mmol/L (ref 3.5–5.1)
Sodium: 136 mmol/L (ref 135–145)
TOTAL PROTEIN: 8.3 g/dL — AB (ref 6.5–8.1)

## 2017-08-02 LAB — MICROALBUMIN / CREATININE URINE RATIO
CREATININE, UR: 228.5 mg/dL
MICROALB UR: 419.5 ug/mL — AB
Microalb Creat Ratio: 183.6 mg/g creat — ABNORMAL HIGH (ref 0.0–30.0)

## 2018-02-05 ENCOUNTER — Other Ambulatory Visit
Admission: RE | Admit: 2018-02-05 | Discharge: 2018-02-05 | Disposition: A | Discharge status: Home / Self Care | Payer: Self-pay | Source: Ambulatory Visit | Attending: Nephrology | Admitting: Nephrology

## 2018-02-05 DIAGNOSIS — I129 Hypertensive chronic kidney disease with stage 1 through stage 4 chronic kidney disease, or unspecified chronic kidney disease: Secondary | ICD-10-CM | POA: Insufficient documentation

## 2018-02-05 DIAGNOSIS — N182 Chronic kidney disease, stage 2 (mild): Secondary | ICD-10-CM | POA: Insufficient documentation

## 2018-02-05 LAB — COMPREHENSIVE METABOLIC PANEL
ALT: 17 U/L (ref 14–54)
ANION GAP: 8 (ref 5–15)
AST: 22 U/L (ref 15–41)
Albumin: 3.8 g/dL (ref 3.5–5.0)
Alkaline Phosphatase: 55 U/L (ref 38–126)
BUN: 16 mg/dL (ref 6–20)
CHLORIDE: 98 mmol/L — AB (ref 101–111)
CO2: 27 mmol/L (ref 22–32)
CREATININE: 1.03 mg/dL — AB (ref 0.44–1.00)
Calcium: 8.8 mg/dL — ABNORMAL LOW (ref 8.9–10.3)
Glucose, Bld: 234 mg/dL — ABNORMAL HIGH (ref 65–99)
POTASSIUM: 3.8 mmol/L (ref 3.5–5.1)
SODIUM: 133 mmol/L — AB (ref 135–145)
Total Bilirubin: 0.5 mg/dL (ref 0.3–1.2)
Total Protein: 8.2 g/dL — ABNORMAL HIGH (ref 6.5–8.1)

## 2018-02-06 LAB — MICROALBUMIN / CREATININE URINE RATIO
CREATININE, UR: 165.1 mg/dL
MICROALB/CREAT RATIO: 448.8 mg/g{creat} — AB (ref 0.0–30.0)
Microalb, Ur: 741 ug/mL — ABNORMAL HIGH
